# Patient Record
Sex: Male | Born: 1950 | Race: White | Hispanic: No | Marital: Single | Smoking: Former smoker
Health system: Southern US, Community
[De-identification: ages and names within clinical notes are randomized; demographics above are authoritative.]

## PROBLEM LIST (undated history)

## (undated) DIAGNOSIS — E785 Hyperlipidemia, unspecified: Secondary | ICD-10-CM

## (undated) DIAGNOSIS — M542 Cervicalgia: Secondary | ICD-10-CM

## (undated) DIAGNOSIS — M949 Disorder of cartilage, unspecified: Secondary | ICD-10-CM

## (undated) DIAGNOSIS — M899 Disorder of bone, unspecified: Secondary | ICD-10-CM

## (undated) DIAGNOSIS — R972 Elevated prostate specific antigen [PSA]: Secondary | ICD-10-CM

## (undated) DIAGNOSIS — C439 Malignant melanoma of skin, unspecified: Secondary | ICD-10-CM

## (undated) DIAGNOSIS — R21 Rash and other nonspecific skin eruption: Secondary | ICD-10-CM

## (undated) DIAGNOSIS — I10 Essential (primary) hypertension: Secondary | ICD-10-CM

## (undated) DIAGNOSIS — H469 Unspecified optic neuritis: Secondary | ICD-10-CM

## (undated) DIAGNOSIS — F411 Generalized anxiety disorder: Secondary | ICD-10-CM

## (undated) DIAGNOSIS — R011 Cardiac murmur, unspecified: Secondary | ICD-10-CM

## (undated) DIAGNOSIS — M19049 Primary osteoarthritis, unspecified hand: Secondary | ICD-10-CM

## (undated) HISTORY — DX: Cervicalgia: M54.2

## (undated) HISTORY — PX: HERNIA REPAIR: SHX51

## (undated) HISTORY — DX: Rash and other nonspecific skin eruption: R21

## (undated) HISTORY — DX: Disorder of bone, unspecified: M89.9

## (undated) HISTORY — DX: Hyperlipidemia, unspecified: E78.5

## (undated) HISTORY — DX: Disorder of cartilage, unspecified: M94.9

## (undated) HISTORY — DX: Generalized anxiety disorder: F41.1

## (undated) HISTORY — DX: Elevated prostate specific antigen (PSA): R97.20

## (undated) HISTORY — DX: Malignant melanoma of skin, unspecified: C43.9

## (undated) HISTORY — PX: CATARACT EXTRACTION: SUR2

## (undated) HISTORY — PX: OTHER SURGICAL HISTORY: SHX169

## (undated) HISTORY — DX: Primary osteoarthritis, unspecified hand: M19.049

---

## 2007-02-12 ENCOUNTER — Ambulatory Visit: Payer: Self-pay | Admitting: Internal Medicine

## 2007-02-12 LAB — CONVERTED CEMR LAB
ALT: 19 units/L (ref 0–40)
AST: 21 units/L (ref 0–37)
Albumin: 4 g/dL (ref 3.5–5.2)
Alkaline Phosphatase: 42 units/L (ref 39–117)
BUN: 5 mg/dL — ABNORMAL LOW (ref 6–23)
Basophils Absolute: 0.1 10*3/uL (ref 0.0–0.1)
Calcium: 9.3 mg/dL (ref 8.4–10.5)
Chloride: 99 meq/L (ref 96–112)
Eosinophils Absolute: 0.2 10*3/uL (ref 0.0–0.6)
Eosinophils Relative: 2.3 % (ref 0.0–5.0)
GFR calc non Af Amer: 125 mL/min
Glucose, Bld: 82 mg/dL (ref 70–99)
Ketones, ur: NEGATIVE mg/dL
MCV: 90.2 fL (ref 78.0–100.0)
Monocytes Relative: 6.7 % (ref 3.0–11.0)
Nitrite: NEGATIVE
Platelets: 295 10*3/uL (ref 150–400)
RBC: 4.71 M/uL (ref 4.22–5.81)
Specific Gravity, Urine: <=1.005 (ref 1.000–1.03)
TSH: 3.09 microintl units/mL (ref 0.35–5.50)
Total CHOL/HDL Ratio: 5
Total Protein, Urine: NEGATIVE mg/dL
Triglycerides: 180 mg/dL — ABNORMAL HIGH (ref 0–149)
Urine Glucose: NEGATIVE mg/dL
WBC: 8.7 10*3/uL (ref 4.5–10.5)

## 2007-02-17 ENCOUNTER — Ambulatory Visit: Payer: Self-pay | Admitting: Internal Medicine

## 2007-05-18 ENCOUNTER — Ambulatory Visit: Payer: Self-pay | Admitting: Internal Medicine

## 2007-05-18 LAB — CONVERTED CEMR LAB
Bilirubin, Direct: 0.1 mg/dL (ref 0.0–0.3)
Cholesterol: 195 mg/dL (ref 0–200)
HDL: 70.2 mg/dL (ref >39.0)
LDL Cholesterol: 111 mg/dL — ABNORMAL HIGH (ref 0–99)
Total Bilirubin: 0.7 mg/dL (ref 0.3–1.2)
Total CHOL/HDL Ratio: 2.8
Total Protein: 7.7 g/dL (ref 6.0–8.3)
Triglycerides: 68 mg/dL (ref 0–149)

## 2008-01-08 ENCOUNTER — Telehealth (INDEPENDENT_AMBULATORY_CARE_PROVIDER_SITE_OTHER): Payer: Self-pay | Admitting: *Deleted

## 2008-01-11 ENCOUNTER — Ambulatory Visit: Payer: Self-pay | Admitting: Internal Medicine

## 2008-01-11 DIAGNOSIS — M949 Disorder of cartilage, unspecified: Secondary | ICD-10-CM

## 2008-01-11 DIAGNOSIS — M899 Disorder of bone, unspecified: Secondary | ICD-10-CM | POA: Insufficient documentation

## 2008-01-11 HISTORY — DX: Disorder of bone, unspecified: M89.9

## 2008-01-14 ENCOUNTER — Encounter: Payer: Self-pay | Admitting: Internal Medicine

## 2008-01-15 ENCOUNTER — Telehealth: Payer: Self-pay | Admitting: Internal Medicine

## 2008-01-19 ENCOUNTER — Ambulatory Visit: Payer: Self-pay | Admitting: Internal Medicine

## 2008-01-19 DIAGNOSIS — R21 Rash and other nonspecific skin eruption: Secondary | ICD-10-CM

## 2008-01-19 DIAGNOSIS — F411 Generalized anxiety disorder: Secondary | ICD-10-CM

## 2008-01-19 DIAGNOSIS — M542 Cervicalgia: Secondary | ICD-10-CM | POA: Insufficient documentation

## 2008-01-19 DIAGNOSIS — E785 Hyperlipidemia, unspecified: Secondary | ICD-10-CM

## 2008-01-19 HISTORY — DX: Generalized anxiety disorder: F41.1

## 2008-01-19 HISTORY — DX: Hyperlipidemia, unspecified: E78.5

## 2008-01-19 HISTORY — DX: Rash and other nonspecific skin eruption: R21

## 2008-01-19 HISTORY — DX: Cervicalgia: M54.2

## 2008-03-31 ENCOUNTER — Telehealth: Payer: Self-pay | Admitting: Internal Medicine

## 2008-04-26 ENCOUNTER — Ambulatory Visit: Payer: Self-pay | Admitting: Internal Medicine

## 2008-04-26 LAB — CONVERTED CEMR LAB
ALT: 35 units/L (ref 0–53)
AST: 38 units/L — ABNORMAL HIGH (ref 0–37)
Albumin: 4.3 g/dL (ref 3.5–5.2)
BUN: 9 mg/dL (ref 6–23)
Basophils Absolute: 0 10*3/uL (ref 0.0–0.1)
Basophils Relative: 0.4 % (ref 0.0–1.0)
CO2: 29 meq/L (ref 19–32)
Calcium: 9.7 mg/dL (ref 8.4–10.5)
Chloride: 104 meq/L (ref 96–112)
Cholesterol: 185 mg/dL (ref 0–200)
Creatinine, Ser: 0.9 mg/dL (ref 0.4–1.5)
Eosinophils Relative: 1.2 % (ref 0.0–5.0)
Glucose, Bld: 69 mg/dL — ABNORMAL LOW (ref 70–99)
Hemoglobin: 14.7 g/dL (ref 13.0–17.0)
LDL Cholesterol: 102 mg/dL — ABNORMAL HIGH (ref 0–99)
Lymphocytes Relative: 25.6 % (ref 12.0–46.0)
MCHC: 35.3 g/dL (ref 30.0–36.0)
MCV: 89.2 fL (ref 78.0–100.0)
Neutro Abs: 5.7 10*3/uL (ref 1.4–7.7)
Neutrophils Relative %: 65.9 % (ref 43.0–77.0)
PSA: 0.48 ng/mL (ref 0.10–4.00)
RBC: 4.68 M/uL (ref 4.22–5.81)
TSH: 2.54 microintl units/mL (ref 0.35–5.50)
Total Protein: 7.7 g/dL (ref 6.0–8.3)
VLDL: 24 mg/dL (ref 0–40)
WBC: 8.6 10*3/uL (ref 4.5–10.5)

## 2008-04-29 ENCOUNTER — Ambulatory Visit: Payer: Self-pay | Admitting: Internal Medicine

## 2008-05-03 LAB — CONVERTED CEMR LAB
Bilirubin Urine: NEGATIVE
Hemoglobin, Urine: NEGATIVE
Nitrite: NEGATIVE
Urobilinogen, UA: 0.2 (ref 0.0–1.0)

## 2009-06-23 ENCOUNTER — Telehealth: Payer: Self-pay | Admitting: Internal Medicine

## 2009-07-03 ENCOUNTER — Ambulatory Visit: Payer: Self-pay | Admitting: Internal Medicine

## 2009-07-03 LAB — CONVERTED CEMR LAB
ALT: 21 units/L (ref 0–53)
Alkaline Phosphatase: 46 units/L (ref 39–117)
Basophils Absolute: 0 10*3/uL (ref 0.0–0.1)
Bilirubin, Direct: 0.1 mg/dL (ref 0.0–0.3)
Chloride: 104 meq/L (ref 96–112)
Cholesterol: 157 mg/dL (ref 0–200)
Creatinine, Ser: 0.9 mg/dL (ref 0.4–1.5)
Eosinophils Absolute: 0.1 10*3/uL (ref 0.0–0.7)
Hemoglobin, Urine: NEGATIVE
Hemoglobin: 14.2 g/dL (ref 13.0–17.0)
Ketones, ur: NEGATIVE mg/dL
LDL Cholesterol: 72 mg/dL (ref 0–99)
Lymphocytes Relative: 23.4 % (ref 12.0–46.0)
MCHC: 34.8 g/dL (ref 30.0–36.0)
Monocytes Relative: 7.4 % (ref 3.0–12.0)
Neutro Abs: 5.7 10*3/uL (ref 1.4–7.7)
Neutrophils Relative %: 67.5 % (ref 43.0–77.0)
Platelets: 250 10*3/uL (ref 150.0–400.0)
RDW: 12.5 % (ref 11.5–14.6)
Sodium: 140 meq/L (ref 135–145)
Total Bilirubin: 0.6 mg/dL (ref 0.3–1.2)
Total CHOL/HDL Ratio: 2
Triglycerides: 88 mg/dL (ref 0.0–149.0)
Urine Glucose: NEGATIVE mg/dL
Urobilinogen, UA: 0.2 (ref 0.0–1.0)
VLDL: 17.6 mg/dL (ref 0.0–40.0)

## 2009-07-10 ENCOUNTER — Ambulatory Visit: Payer: Self-pay | Admitting: Internal Medicine

## 2009-08-25 ENCOUNTER — Telehealth: Payer: Self-pay | Admitting: Internal Medicine

## 2010-01-04 ENCOUNTER — Encounter: Payer: Self-pay | Admitting: Internal Medicine

## 2010-02-13 ENCOUNTER — Telehealth (INDEPENDENT_AMBULATORY_CARE_PROVIDER_SITE_OTHER): Payer: Self-pay | Admitting: *Deleted

## 2010-02-19 ENCOUNTER — Encounter: Payer: Self-pay | Admitting: Internal Medicine

## 2010-02-23 ENCOUNTER — Encounter (INDEPENDENT_AMBULATORY_CARE_PROVIDER_SITE_OTHER): Payer: Self-pay | Admitting: *Deleted

## 2010-03-09 ENCOUNTER — Encounter: Payer: Self-pay | Admitting: Internal Medicine

## 2010-04-12 ENCOUNTER — Telehealth: Payer: Self-pay | Admitting: Internal Medicine

## 2010-07-09 ENCOUNTER — Ambulatory Visit: Payer: Self-pay | Admitting: Internal Medicine

## 2010-07-09 LAB — CONVERTED CEMR LAB
ALT: 23 units/L (ref 0–53)
Basophils Relative: 0.7 % (ref 0.0–3.0)
Bilirubin Urine: NEGATIVE
Bilirubin, Direct: 0.1 mg/dL (ref 0.0–0.3)
CO2: 29 meq/L (ref 19–32)
Calcium: 9 mg/dL (ref 8.4–10.5)
Creatinine, Ser: 0.8 mg/dL (ref 0.4–1.5)
Eosinophils Relative: 2.1 % (ref 0.0–5.0)
Glucose, Bld: 72 mg/dL (ref 70–99)
HCT: 41 % (ref 39.0–52.0)
Hemoglobin: 14.4 g/dL (ref 13.0–17.0)
Ketones, ur: NEGATIVE mg/dL
Leukocytes, UA: NEGATIVE
Lymphs Abs: 2.4 10*3/uL (ref 0.7–4.0)
MCV: 92.4 fL (ref 78.0–100.0)
Monocytes Absolute: 0.6 10*3/uL (ref 0.1–1.0)
Neutro Abs: 5.1 10*3/uL (ref 1.4–7.7)
Neutrophils Relative %: 61.5 % (ref 43.0–77.0)
PSA: 3.58 ng/mL (ref 0.10–4.00)
RBC: 4.44 M/uL (ref 4.22–5.81)
Total Protein: 7 g/dL (ref 6.0–8.3)
WBC: 8.3 10*3/uL (ref 4.5–10.5)
pH: 5.5 (ref 5.0–8.0)

## 2010-07-12 ENCOUNTER — Ambulatory Visit: Payer: Self-pay | Admitting: Internal Medicine

## 2010-07-12 DIAGNOSIS — R972 Elevated prostate specific antigen [PSA]: Secondary | ICD-10-CM | POA: Insufficient documentation

## 2010-07-12 HISTORY — DX: Elevated prostate specific antigen (PSA): R97.20

## 2010-07-13 ENCOUNTER — Encounter (INDEPENDENT_AMBULATORY_CARE_PROVIDER_SITE_OTHER): Payer: Self-pay | Admitting: *Deleted

## 2010-08-13 ENCOUNTER — Encounter: Payer: Self-pay | Admitting: Internal Medicine

## 2010-09-11 ENCOUNTER — Encounter: Payer: Self-pay | Admitting: Internal Medicine

## 2010-09-17 ENCOUNTER — Encounter: Payer: Self-pay | Admitting: Internal Medicine

## 2010-12-24 ENCOUNTER — Encounter: Payer: Self-pay | Admitting: Internal Medicine

## 2011-01-15 NOTE — Op Note (Signed)
Summary: [facility]   Imported By: Sherian Rein 02/23/2010 11:38:19  _____________________________________________________________________  External Attachment:    Type:   Image     Comment:   External Document

## 2011-01-15 NOTE — Progress Notes (Signed)
Summary: Rx refill  Phone Note Call from Patient   Caller: Patient [phone] Summary of Call: pt called requesting refills of Cialis 20mg . Pt had no more refills because he was buying #10 per month instead of the #5 Rx'd. Pt said that his Rx used to be for #10 1 by mouth q other day and this is why he bought more. Pt has scheduled CPX 07/28. Pt is requesting 2 mth refill until appt. Initial call taken by: Margaret Pyle, CMA,  April 12, 2010 2:25 PM  Follow-up for Phone Call        ok for 10/month - to robin to handle Follow-up by: Corwin Levins MD,  April 12, 2010 2:39 PM    Prescriptions: CIALIS 20 MG TABS (TADALAFIL) 1 by mouth every other day  #10 x 1   Entered by:   Scharlene Gloss   Authorized by:   Corwin Levins MD   Signed by:   Scharlene Gloss on 04/12/2010   Method used:   Faxed to ...       [facility]  [address] [phone]* (retail)       [address]       [place], [place]  [address]       Ph: [phone] or [phone]       Fax: [phone]   RxID:   [id number]

## 2011-01-15 NOTE — Letter (Signed)
Summary: [facility] Consult Scheduled Letter  [facility]  [address] [place], [place] [address]   Phone: [phone]  Fax: [phone]      07/13/2010 MRN: [id number]  [facility] [address] [place], [place]  [address]    Dear Mr. Gouveia,      We have scheduled an appointment for you. At the recommendation of Dr.John,we have scheduled you a consult with [facility](Dr.Peterson)on August 29,2011 at 2:45 PM. Their phone number is [phone].  If this appointment day and time is not convenient for you, please feel free to call the office of the doctor you are being referred to at the number listed above and reschedule the appointment.  [facility] [address] [place] [address] [facility] [address] [place], [place] [address]  Thank you,  Patient Care Coordinator [facility]

## 2011-01-15 NOTE — Letter (Signed)
Summary: [facility]  [facility]   Imported By: Lennie Odor 08/21/2010 14:17:24  _____________________________________________________________________  External Attachment:    Type:   Image     Comment:   External Document

## 2011-01-15 NOTE — Progress Notes (Signed)
  Faxed 12,LOV over to [facility] to fax [phone] [facility]  February 13, 2010 1:35 PM

## 2011-01-15 NOTE — Miscellaneous (Signed)
Summary: Orders Update   Clinical Lists Changes  Orders: Added new Referral order of Gastroenterology Referral (GI) - Signed 

## 2011-01-15 NOTE — Assessment & Plan Note (Signed)
Summary: CPX / NWS  #   Vital Signs:  Patient profile:   60 year old male Height:      73 inches (185.42 cm) Weight:      165.38 pounds (75.17 kg) BMI:     21.90 O2 Sat:      97 % on Room air Temp:     97.1 degrees F (36.17 degrees C) oral Pulse rate:   73 / minute BP sitting:   122 / 76  (left arm) Cuff size:   regular  Vitals Entered By: Brenton Grills MA (July 12, 2010 10:28 AM)  O2 Flow:  Room air CC: Physical/refills on all meds/aj   Primary Care Provider:  Jonny Ruiz  CC:  Physical/refills on all meds/aj.  History of Present Illness: overall doing well.  Pt denies CP, sob, doe, wheezing, orthopnea, pnd, worsening LE edema, palps, dizziness or syncope  Pt denies new neuro symptoms such as headache, facial or extremity weakness   No fever, wt loss, night sweats, loss of appetite or other constitutional symptoms   No new complaints.    Problems Prior to Update: 1)  Psa, Increased  (ICD-790.93) 2)  Preventive Health Care  (ICD-V70.0) 3)  Anxiety  (ICD-300.00) 4)  Hyperlipidemia  (ICD-272.4) 5)  Rash-nonvesicular  (ICD-782.1) 6)  Cervicalgia  (ICD-723.1) 7)  Disorder of Bone and Cartilage Unspecified  (ICD-733.90)  Medications Prior to Update: 1)  Viagra 100 Mg Tabs (Sildenafil Citrate) .Marland Kitchen.. 1po Every Other Day As Needed 2)  Crestor 40 Mg Tabs (Rosuvastatin Calcium) .... Take 1/2 Tablet By Mouth Once A Day 3)  Cialis 20 Mg Tabs (Tadalafil) .Marland Kitchen.. 1 By Mouth Every Other Day 4)  Triamcinolone Acetonide 0.5 % Crea (Triamcinolone Acetonide) .... Use Asd Two Times A Day As Needed  Current Medications (verified): 1)  Viagra 100 Mg Tabs (Sildenafil Citrate) .Marland Kitchen.. 1po Every Other Day As Needed 2)  Crestor 40 Mg Tabs (Rosuvastatin Calcium) .... Take 1/2 Tablet By Mouth Once A Day 3)  Cialis 20 Mg Tabs (Tadalafil) .Marland Kitchen.. 1 By Mouth Every Other Day 4)  Aspir-Low 81 Mg Tbec (Aspirin) .Marland Kitchen.. 1 By Mouth Once Daily  Allergies (verified): 1)  ! Pcn  Past History:  Past Medical  History: Last updated: 07/10/2009 hx of childhood glomerulonephritis Hyperlipidemia E.D. 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         all otherwise negative per pt -    Physical Exam  General:  alert and well-developed.   Head:  normocephalic and atraumatic.   Eyes:  vision grossly intact, pupils equal, and pupils round.   Ears:  R ear normal and L ear normal.   Nose:  no external deformity and no nasal discharge.   Mouth:  no gingival abnormalities and pharynx  pink and moist.   Neck:  supple and no masses.   Lungs:  normal respiratory effort and normal breath sounds.   Heart:  normal rate and regular rhythm.   Abdomen:  soft, non-tender, and normal bowel sounds.   Msk:  no joint tenderness and no joint swelling.   Extremities:  no edema, no erythema  Neurologic:  cranial nerves II-XII intact and strength normal in all extremities.   Skin:  color normal and no rashes.   Psych:  not anxious appearing and not  depressed appearing.     Impression & Recommendations:  Problem # 1:  Preventive Health Care (ICD-V70.0) Overall doing well, age appropriate education and counseling updated and referral for appropriate preventive services done unless declined, immunizations up to date or declined, diet counseling done if overweight, urged to quit smoking if smokes , most recent labs reviewed and current ordered if appropriate, ecg reviewed or declined (interpretation per ECG scanned in the EMR if done); information regarding Medicare Prevention requirements given if appropriate; speciality referrals updated as appropriate   Problem # 2:  PSA, INCREASED (ICD-790.93) PSA velocity quite remarkable from july 2010 -  for urology referral Orders: Urology Referral (Urology)  Complete Medication List: 1)  Viagra 100 Mg Tabs (Sildenafil citrate) .Marland Kitchen.. 1po every other day as needed 2)  Crestor 40 Mg Tabs (Rosuvastatin calcium) .... Take 1/2 tablet by mouth once a day 3)  Cialis 20 Mg Tabs (Tadalafil) .Marland Kitchen.. 1 by mouth every other day 4)  Aspir-low 81 Mg Tbec (Aspirin) .Marland Kitchen.. 1 by mouth once daily  Patient Instructions: 1)  Continue all previous medications as before this visit 2)  You will be contacted about the referral(s) to: Urology - Dr Vonita Moss 3)  You are given the refills today 4)  Please schedule a follow-up appointment in 1 year or sooner if needed 5)  Take an Aspirin every day - 81 mg - 1 per day  - COATED only - to help reduce chance of stroke or heart disease 6)  We will call in 3 mo to get you scheduled for the screening colonoscopy Prescriptions: CIALIS 20 MG TABS (TADALAFIL) 1 by mouth every other day  #10 x 11   Entered and Authorized by:   Corwin Levins MD   Signed by:   Corwin Levins MD on 07/12/2010   Method used:   Print then Give to Patient   RxID:   [id number] VIAGRA 100 MG TABS (SILDENAFIL CITRATE) 1po every other day as needed  #10 x 11   Entered and Authorized by:   Corwin Levins MD    Signed by:   Corwin Levins MD on 07/12/2010   Method used:   Print then Give to Patient   RxID:   [id number] CRESTOR 40 MG TABS (ROSUVASTATIN CALCIUM) Take 1/2 tablet by mouth once a day  #45 x 3   Entered and Authorized by:   Corwin Levins MD   Signed by:   Corwin Levins MD on 07/12/2010   Method used:   Print then Give to Patient   RxID:   [id number]

## 2011-01-15 NOTE — Letter (Signed)
Summary: Referral - not able to see patient  [facility]  [address] [place], [place] [address]   Phone: [phone]  Fax: [phone]    February 23, 2010   Re:   Gerald Jenkins DOB:  [birth year] MRN:   [id number]    Dear Dr. Jonny Ruiz:  Thank you for your kind referral of the above patient.  We have attempted to schedule the recommended procedure, colonoscopy, but have not been able to schedule because:  _x_ The patient was not available by phone and/or has not returned our calls.  ___ The patient declined to schedule the procedure at this time.  We appreciate the referral and hope that we will have the opportunity to treat this patient in the future.    Sincerely,    [facility] [phone]

## 2011-01-15 NOTE — Letter (Signed)
Summary: [facility]   Imported By: Sherian Rein 04/17/2010 08:55:55  _____________________________________________________________________  External Attachment:    Type:   Image     Comment:   External Document

## 2011-01-15 NOTE — Letter (Signed)
Summary: [facility]   Imported By: Lester [place] 01/18/2010 10:00:28  _____________________________________________________________________  External Attachment:    Type:   Image     Comment:   External Document

## 2011-01-17 NOTE — Letter (Signed)
Summary: Referral - not able to see patient  [facility]  [address] [place], [place] [address]   Phone: [phone]  Fax: [phone]    December 24, 2010  Oliver Barre, MD [address] [place], [place] [address]   Re:   NIK GORRELL DOB:  1951/10/22 MRN:   [id number]    Dear Dr. Jonny Ruiz:  Thank you for your kind referral of the above patient.  We have attempted to schedule the recommended procedure Screening Colonoscopy but have not been able to schedule because:  X   The patient was not available by phone and/or has not returned our calls.  ___ The patient declined to schedule the procedure at this time.  We appreciate the referral and hope that we will have the opportunity to treat this patient in the future.    Sincerely,    [facility] [phone]

## 2011-02-12 ENCOUNTER — Encounter: Payer: Self-pay | Admitting: Internal Medicine

## 2011-07-16 ENCOUNTER — Other Ambulatory Visit: Payer: Self-pay

## 2011-07-18 ENCOUNTER — Ambulatory Visit: Payer: Self-pay

## 2011-07-18 DIAGNOSIS — Z0000 Encounter for general adult medical examination without abnormal findings: Secondary | ICD-10-CM

## 2011-07-18 DIAGNOSIS — Z0389 Encounter for observation for other suspected diseases and conditions ruled out: Secondary | ICD-10-CM

## 2011-07-18 LAB — HEPATIC FUNCTION PANEL
AST: 26 U/L (ref 0–37)
Albumin: 4.4 g/dL (ref 3.5–5.2)
Alkaline Phosphatase: 43 U/L (ref 39–117)
Bilirubin, Direct: 0.1 mg/dL (ref 0.0–0.3)
Total Protein: 7.2 g/dL (ref 6.0–8.3)

## 2011-07-18 LAB — CBC WITH DIFFERENTIAL/PLATELET
Basophils Relative: 0.4 % (ref 0.0–3.0)
Eosinophils Relative: 2.2 % (ref 0.0–5.0)
HCT: 43 % (ref 39.0–52.0)
Hemoglobin: 14.5 g/dL (ref 13.0–17.0)
Lymphocytes Relative: 24.8 % (ref 12.0–46.0)
Lymphs Abs: 1.8 10*3/uL (ref 0.7–4.0)
Monocytes Relative: 8.3 % (ref 3.0–12.0)
Neutro Abs: 4.6 10*3/uL (ref 1.4–7.7)
RBC: 4.68 Mil/uL (ref 4.22–5.81)

## 2011-07-18 LAB — URINALYSIS
Bilirubin Urine: NEGATIVE
Ketones, ur: NEGATIVE
Leukocytes, UA: NEGATIVE
Nitrite: NEGATIVE
Specific Gravity, Urine: 1.02 (ref 1.000–1.030)
Total Protein, Urine: NEGATIVE
pH: 5.5 (ref 5.0–8.0)

## 2011-07-18 LAB — BASIC METABOLIC PANEL
Calcium: 9.2 mg/dL (ref 8.4–10.5)
GFR: 87.26 mL/min (ref >60.00)
Glucose, Bld: 81 mg/dL (ref 70–99)
Potassium: 4.3 mEq/L (ref 3.5–5.1)
Sodium: 143 mEq/L (ref 135–145)

## 2011-07-18 LAB — LIPID PANEL
HDL: 75 mg/dL (ref >39.00)
Total CHOL/HDL Ratio: 2
VLDL: 30.2 mg/dL (ref 0.0–40.0)

## 2011-07-21 ENCOUNTER — Encounter: Payer: Self-pay | Admitting: Internal Medicine

## 2011-07-21 DIAGNOSIS — Z0000 Encounter for general adult medical examination without abnormal findings: Secondary | ICD-10-CM | POA: Insufficient documentation

## 2011-07-23 ENCOUNTER — Ambulatory Visit (INDEPENDENT_AMBULATORY_CARE_PROVIDER_SITE_OTHER): Payer: 59 | Admitting: Internal Medicine

## 2011-07-23 ENCOUNTER — Encounter: Payer: Self-pay | Admitting: Internal Medicine

## 2011-07-23 VITALS — BP 130/72 | HR 70 | Temp 98.5°F | Ht 73.0 in | Wt 171.6 lb

## 2011-07-23 DIAGNOSIS — Z0000 Encounter for general adult medical examination without abnormal findings: Secondary | ICD-10-CM

## 2011-07-23 NOTE — Progress Notes (Signed)
Subjective:    Patient ID: Gerald Jenkins, male    DOB: 1951/10/01, 60 y.o.   MRN: [id number]  HPI Here for wellness and f/u;  Overall doing ok;  Pt denies CP, worsening SOB, DOE, wheezing, orthopnea, PND, worsening LE edema, palpitations, dizziness or syncope.  Pt denies neurological change such as new Headache, facial or extremity weakness.  Pt denies polydipsia, polyuria, or low sugar symptoms. Pt states overall good compliance with treatment and medications, good tolerability, and trying to follow lower cholesterol diet.  Pt denies worsening depressive symptoms, suicidal ideation or panic. No fever, wt loss, night sweats, loss of appetite, or other constitutional symptoms.  Pt states good ability with ADL's, low fall risk, home safety reviewed and adequate, no significant changes in hearing or vision, and occasionally active with exercise.  Still smokes < 1/2 ppd in the evenings to relax.  Also with mild tenderness to left elbow, worse later in the workday Past Medical History  Diagnosis Date  . ANXIETY 01/19/2008  . Cervicalgia 01/19/2008  . DISORDER OF BONE AND CARTILAGE UNSPECIFIED 01/11/2008  . HYPERLIPIDEMIA 01/19/2008  . PSA, INCREASED 07/12/2010  . RASH-NONVESICULAR 01/19/2008   Past Surgical History  Procedure Date  . Cataract extraction   . Lumbar disease     reports that he has been smoking.  He does not have any smokeless tobacco history on file. He reports that he drinks alcohol. His drug history not on file. family history includes Cancer in his other; Diabetes in his other; Lumbar disc disease in his father; and Multiple sclerosis in his other. Allergies  Allergen Reactions  . Penicillins    Current Outpatient Prescriptions on File Prior to Visit  Medication Sig Dispense Refill  . aspirin 81 MG tablet Take 81 mg by mouth daily.        . rosuvastatin (CRESTOR) 40 MG tablet Take 1/2 tablet by mouth once daily       . sildenafil (VIAGRA) 100 MG tablet 1 by mouth every other day as  needed       . tadalafil (CIALIS) 20 MG tablet 1 by mouth every other day        Review of Systems Review of Systems  Constitutional: Negative for diaphoresis, activity change, appetite change and unexpected weight change.  HENT: Negative for hearing loss, ear pain, facial swelling, mouth sores and neck stiffness.   Eyes: Negative for pain, redness and visual disturbance.  Respiratory: Negative for shortness of breath and wheezing.   Cardiovascular: Negative for chest pain and palpitations.  Gastrointestinal: Negative for diarrhea, blood in stool, abdominal distention and rectal pain.  Genitourinary: Negative for hematuria, flank pain and decreased urine volume.  Musculoskeletal: Negative for myalgias and joint swelling.  Skin: Negative for color change and wound.  Neurological: Negative for syncope and numbness.  Hematological: Negative for adenopathy.  Psychiatric/Behavioral: Negative for hallucinations, self-injury, decreased concentration and agitation.      Objective:   Physical Exam BP 130/72  Pulse 70  Temp(Src) 98.5 F (36.9 C) (Oral)  Ht 6\' 1"  (1.854 m)  Wt 171 lb 9.6 oz (77.837 kg)  BMI 22.64 kg/m2  SpO2 97% Physical Exam  VS noted Constitutional: Pt is oriented to person, place, and time. Appears well-developed and well-nourished.  HENT:  Head: Normocephalic and atraumatic.  Right Ear: External ear normal.  Left Ear: External ear normal.  Nose: Nose normal.  Mouth/Throat: Oropharynx is clear and moist.  Eyes: Conjunctivae and EOM are normal. Pupils are equal, round, and  reactive to light.  Neck: Normal range of motion. Neck supple. No JVD present. No tracheal deviation present.  Cardiovascular: Normal rate, regular rhythm, normal heart sounds and intact distal pulses.   Pulmonary/Chest: Effort normal and breath sounds normal.  Abdominal: Soft. Bowel sounds are normal. There is no tenderness.  Musculoskeletal: Normal range of motion. Exhibits no edema.    Lymphadenopathy:  Has no cervical adenopathy.  Neurological: Pt is alert and oriented to person, place, and time. Pt has normal reflexes. No cranial nerve deficit.  Skin: Skin is warm and dry. No rash noted.  Psychiatric:  Has  normal mood and affect. Behavior is normal.         Assessment & Plan:

## 2011-07-23 NOTE — Patient Instructions (Addendum)
You will be contacted regarding the referral for: colonoscopy Please call if you need orthopedic for the left elbow tendonitis Please stop smoking Continue all medications as before Please have the pharmacy notify us when you need refills Please return in 1 year for your yearly visit, or sooner if needed, with Lab testing done 3-5 days before

## 2011-08-08 ENCOUNTER — Other Ambulatory Visit: Payer: Self-pay

## 2011-08-08 MED ORDER — ROSUVASTATIN CALCIUM 40 MG PO TABS: 40.0000 mg | ORAL_TABLET | Freq: Every day | ORAL | Status: DC

## 2011-08-08 MED ORDER — TADALAFIL 20 MG PO TABS: ORAL_TABLET | ORAL | Status: DC

## 2011-08-08 MED ORDER — SILDENAFIL CITRATE 100 MG PO TABS: ORAL_TABLET | ORAL | Status: DC

## 2011-09-30 ENCOUNTER — Encounter: Payer: Self-pay | Admitting: Gastroenterology

## 2012-03-06 ENCOUNTER — Other Ambulatory Visit: Payer: Self-pay

## 2012-03-06 MED ORDER — ROSUVASTATIN CALCIUM 40 MG PO TABS: 40.0000 mg | ORAL_TABLET | Freq: Every day | ORAL | Status: DC

## 2012-08-06 ENCOUNTER — Other Ambulatory Visit: Payer: Self-pay | Admitting: Internal Medicine

## 2012-08-11 ENCOUNTER — Other Ambulatory Visit (INDEPENDENT_AMBULATORY_CARE_PROVIDER_SITE_OTHER): Payer: 59

## 2012-08-11 DIAGNOSIS — Z0000 Encounter for general adult medical examination without abnormal findings: Secondary | ICD-10-CM

## 2012-08-11 LAB — BASIC METABOLIC PANEL
BUN: 14 mg/dL (ref 6–23)
CO2: 28 mEq/L (ref 19–32)
Chloride: 107 mEq/L (ref 96–112)
Creatinine, Ser: 0.8 mg/dL (ref 0.4–1.5)
Glucose, Bld: 81 mg/dL (ref 70–99)

## 2012-08-11 LAB — CBC WITH DIFFERENTIAL/PLATELET
Basophils Relative: 0.3 % (ref 0.0–3.0)
Eosinophils Absolute: 0.2 10*3/uL (ref 0.0–0.7)
Eosinophils Relative: 1.9 % (ref 0.0–5.0)
Lymphocytes Relative: 28.7 % (ref 12.0–46.0)
MCHC: 33.3 g/dL (ref 30.0–36.0)
Neutrophils Relative %: 59.9 % (ref 43.0–77.0)
RBC: 4.58 Mil/uL (ref 4.22–5.81)
WBC: 8.1 10*3/uL (ref 4.5–10.5)

## 2012-08-11 LAB — HEPATIC FUNCTION PANEL
Alkaline Phosphatase: 41 U/L (ref 39–117)
Bilirubin, Direct: 0.1 mg/dL (ref 0.0–0.3)
Total Bilirubin: 0.5 mg/dL (ref 0.3–1.2)
Total Protein: 7.1 g/dL (ref 6.0–8.3)

## 2012-08-11 LAB — URINALYSIS, ROUTINE W REFLEX MICROSCOPIC
Ketones, ur: NEGATIVE
Specific Gravity, Urine: 1.025 (ref 1.000–1.030)
Urine Glucose: NEGATIVE
pH: 6 (ref 5.0–8.0)

## 2012-08-11 LAB — LIPID PANEL
Cholesterol: 159 mg/dL (ref 0–200)
HDL: 67.8 mg/dL (ref >39.00)
VLDL: 20.8 mg/dL (ref 0.0–40.0)

## 2012-08-13 ENCOUNTER — Encounter: Payer: Self-pay | Admitting: Internal Medicine

## 2012-08-13 ENCOUNTER — Ambulatory Visit (INDEPENDENT_AMBULATORY_CARE_PROVIDER_SITE_OTHER): Payer: No Typology Code available for payment source | Admitting: Internal Medicine

## 2012-08-13 VITALS — BP 134/80 | HR 84 | Temp 97.9°F | Ht 73.5 in | Wt 175.2 lb

## 2012-08-13 DIAGNOSIS — Z0000 Encounter for general adult medical examination without abnormal findings: Secondary | ICD-10-CM

## 2012-08-13 MED ORDER — ROSUVASTATIN CALCIUM 40 MG PO TABS: 40.0000 mg | ORAL_TABLET | Freq: Every day | ORAL | Status: DC

## 2012-08-13 MED ORDER — SILDENAFIL CITRATE 100 MG PO TABS: ORAL_TABLET | ORAL | Status: DC

## 2012-08-13 MED ORDER — TADALAFIL 20 MG PO TABS: ORAL_TABLET | ORAL | Status: DC

## 2012-08-13 NOTE — Patient Instructions (Addendum)
Continue all other medications as before Your refills were done today as requested You will be contacted regarding the referral for: colonoscopy You are otherwise up to date with prevention We should consider the shingles shot at your visit in 1 yr Please return in 1 year for your yearly visit, or sooner if needed, with Lab testing done 3-5 days before

## 2012-08-13 NOTE — Assessment & Plan Note (Signed)
Overall doing well, age appropriate education and counseling updated, referrals for preventative services and immunizations addressed, dietary and smoking counseling addressed, most recent labs and ECG reviewed.  I have personally reviewed and have noted: 1) the patient's medical and social history 2) The pt's use of alcohol, tobacco, and illicit drugs 3) The patient's current medications and supplements 4) Functional ability including ADL's, fall risk, home safety risk, hearing and visual impairment 5) Diet and physical activities 6) Evidence for depression or mood disorder 7) The patient's height, weight, and BMI have been recorded in the chart I have made referrals, and provided counseling and education based on review of the above For colonscopy as he is due, ECG reviewed as per emr

## 2012-08-15 ENCOUNTER — Encounter: Payer: Self-pay | Admitting: Internal Medicine

## 2012-08-15 NOTE — Progress Notes (Signed)
Subjective:    Patient ID: Gerald Jenkins, male    DOB: April 03, 1951, 61 y.o.   MRN: [id number]  HPI  Here for wellness and f/u;  Overall doing ok;  Pt denies CP, worsening SOB, DOE, wheezing, orthopnea, PND, worsening LE edema, palpitations, dizziness or syncope.  Pt denies neurological change such as new Headache, facial or extremity weakness.  Pt denies polydipsia, polyuria, or low sugar symptoms. Pt states overall good compliance with treatment and medications, good tolerability, and trying to follow lower cholesterol diet.  Pt denies worsening depressive symptoms, suicidal ideation or panic. No fever, wt loss, night sweats, loss of appetite, or other constitutional symptoms.  Pt states good ability with ADL's, low fall risk, home safety reviewed and adequate, no significant changes in hearing or vision, and occasionally active with exercise.  No acute complaints.  Wt is up several lbs over next yr from 171 to 175 Past Medical History  Diagnosis Date  . ANXIETY 01/19/2008  . Cervicalgia 01/19/2008  . DISORDER OF BONE AND CARTILAGE UNSPECIFIED 01/11/2008  . HYPERLIPIDEMIA 01/19/2008  . PSA, INCREASED 07/12/2010  . RASH-NONVESICULAR 01/19/2008   Past Surgical History  Procedure Date  . Cataract extraction   . Lumbar disease     reports that he has been smoking.  He does not have any smokeless tobacco history on file. He reports that he drinks alcohol. His drug history not on file. family history includes Cancer in his other; Diabetes in his other; Lumbar disc disease in his father; and Multiple sclerosis in his other. Allergies  Allergen Reactions  . Penicillins    Current Outpatient Prescriptions on File Prior to Visit  Medication Sig Dispense Refill  . aspirin 81 MG tablet Take 81 mg by mouth daily.        . rosuvastatin (CRESTOR) 40 MG tablet Take 1 tablet (40 mg total) by mouth daily. Take 1/2 tablet by mouth once daily  45 tablet  3  . sildenafil (VIAGRA) 100 MG tablet 1 by mouth every other  day as needed  10 tablet  11  . tadalafil (CIALIS) 20 MG tablet 1 by mouth every other day  10 tablet  11   Review of Systems Review of Systems  Constitutional: Negative for diaphoresis, activity change, appetite change and unexpected weight change.  HENT: Negative for hearing loss, ear pain, facial swelling, mouth sores and neck stiffness.   Eyes: Negative for pain, redness and visual disturbance.  Respiratory: Negative for shortness of breath and wheezing.   Cardiovascular: Negative for chest pain and palpitations.  Gastrointestinal: Negative for diarrhea, blood in stool, abdominal distention and rectal pain.  Genitourinary: Negative for hematuria, flank pain and decreased urine volume.  Musculoskeletal: Negative for myalgias and joint swelling.  Skin: Negative for color change and wound.  Neurological: Negative for syncope and numbness.  Hematological: Negative for adenopathy.  Psychiatric/Behavioral: Negative for hallucinations, self-injury, decreased concentration and agitation.      Objective:   Physical Exam BP 134/80  Pulse 84  Temp 97.9 F (36.6 C) (Oral)  Ht 6' 1.5" (1.867 m)  Wt 175 lb 4 oz (79.493 kg)  BMI 22.81 kg/m2  SpO2 97% Physical Exam  VS noted Constitutional: Pt is oriented to person, place, and time. Appears well-developed and well-nourished.  HENT:  Head: Normocephalic and atraumatic.  Right Ear: External ear normal.  Left Ear: External ear normal.  Nose: Nose normal.  Mouth/Throat: Oropharynx is clear and moist.  Eyes: Conjunctivae and EOM are normal. Pupils are  equal, round, and reactive to light.  Neck: Normal range of motion. Neck supple. No JVD present. No tracheal deviation present.  Cardiovascular: Normal rate, regular rhythm, normal heart sounds and intact distal pulses.   Pulmonary/Chest: Effort normal and breath sounds normal.  Abdominal: Soft. Bowel sounds are normal. There is no tenderness.  Musculoskeletal: Normal range of motion. Exhibits  no edema.  Lymphadenopathy:  Has no cervical adenopathy.  Neurological: Pt is alert and oriented to person, place, and time. Pt has normal reflexes. No cranial nerve deficit. Motor/dtr/gait intact Skin: Skin is warm and dry. No rash noted.  Psychiatric:  Has  normal mood and affect. Behavior is normal.     Assessment & Plan:

## 2013-07-19 ENCOUNTER — Telehealth: Payer: Self-pay

## 2013-07-19 DIAGNOSIS — Z0000 Encounter for general adult medical examination without abnormal findings: Secondary | ICD-10-CM

## 2013-07-19 NOTE — Telephone Encounter (Signed)
cpx labs entered  

## 2013-08-17 ENCOUNTER — Other Ambulatory Visit (INDEPENDENT_AMBULATORY_CARE_PROVIDER_SITE_OTHER): Payer: No Typology Code available for payment source

## 2013-08-17 DIAGNOSIS — Z0000 Encounter for general adult medical examination without abnormal findings: Secondary | ICD-10-CM

## 2013-08-17 LAB — CBC WITH DIFFERENTIAL/PLATELET
Basophils Relative: 0.5 % (ref 0.0–3.0)
Eosinophils Relative: 2.5 % (ref 0.0–5.0)
HCT: 40.6 % (ref 39.0–52.0)
Lymphs Abs: 2.5 10*3/uL (ref 0.7–4.0)
MCV: 90.3 fl (ref 78.0–100.0)
Monocytes Absolute: 0.9 10*3/uL (ref 0.1–1.0)
Neutro Abs: 5 10*3/uL (ref 1.4–7.7)
RBC: 4.49 Mil/uL (ref 4.22–5.81)
WBC: 8.6 10*3/uL (ref 4.5–10.5)

## 2013-08-17 LAB — URINALYSIS, ROUTINE W REFLEX MICROSCOPIC
Nitrite: NEGATIVE
Specific Gravity, Urine: 1.02 (ref 1.000–1.030)
Total Protein, Urine: NEGATIVE
Urine Glucose: NEGATIVE
Urobilinogen, UA: 0.2 (ref 0.0–1.0)
WBC, UA: NONE SEEN (ref 0–?)

## 2013-08-17 LAB — HEPATIC FUNCTION PANEL
Albumin: 4 g/dL (ref 3.5–5.2)
Alkaline Phosphatase: 36 U/L — ABNORMAL LOW (ref 39–117)
Bilirubin, Direct: 0.1 mg/dL (ref 0.0–0.3)
Total Protein: 6.9 g/dL (ref 6.0–8.3)

## 2013-08-17 LAB — BASIC METABOLIC PANEL
CO2: 27 mEq/L (ref 19–32)
Calcium: 9.1 mg/dL (ref 8.4–10.5)
Creatinine, Ser: 1.1 mg/dL (ref 0.4–1.5)
GFR: 76.26 mL/min (ref >60.00)
Sodium: 136 mEq/L (ref 135–145)

## 2013-08-17 LAB — LIPID PANEL
LDL Cholesterol: 72 mg/dL (ref 0–99)
Total CHOL/HDL Ratio: 3

## 2013-08-19 ENCOUNTER — Ambulatory Visit (INDEPENDENT_AMBULATORY_CARE_PROVIDER_SITE_OTHER): Payer: BC Managed Care – PPO | Admitting: Internal Medicine

## 2013-08-19 ENCOUNTER — Encounter: Payer: Self-pay | Admitting: Internal Medicine

## 2013-08-19 VITALS — BP 122/70 | HR 81 | Temp 97.2°F | Ht 73.5 in | Wt 182.0 lb

## 2013-08-19 DIAGNOSIS — Z23 Encounter for immunization: Secondary | ICD-10-CM

## 2013-08-19 DIAGNOSIS — Z2911 Encounter for prophylactic immunotherapy for respiratory syncytial virus (RSV): Secondary | ICD-10-CM

## 2013-08-19 DIAGNOSIS — Z0000 Encounter for general adult medical examination without abnormal findings: Secondary | ICD-10-CM

## 2013-08-19 MED ORDER — ROSUVASTATIN CALCIUM 40 MG PO TABS: ORAL_TABLET | ORAL | Status: DC

## 2013-08-19 NOTE — Patient Instructions (Addendum)
You had the shingles shot today  You will be contacted regarding the referral for: colonoscopy  Please continue all other medications as before, and refills have been done if requested. Please have the pharmacy call with any other refills you may need.  Please continue your efforts at being more active, low cholesterol diet, and weight control. You are otherwise up to date with prevention measures today.  Please remember to sign up for My Chart if you have not done so, as this will be important to you in the future with finding out test results, communicating by private email, and scheduling acute appointments online when needed.  Please return in 1 year for your yearly visit, or sooner if needed, with Lab testing done 3-5 days before

## 2013-08-19 NOTE — Assessment & Plan Note (Addendum)

## 2013-08-19 NOTE — Addendum Note (Signed)
Addended by: Scharlene Gloss B on: 08/19/2013 04:01 PM   Modules accepted: Orders

## 2013-08-19 NOTE — Progress Notes (Signed)
Subjective:    Patient ID: Gerald Jenkins, male    DOB: 1951-07-05, 62 y.o.   MRN: [id number]  HPI  Here for wellness and f/u;  Overall doing ok;  Pt denies CP, worsening SOB, DOE, wheezing, orthopnea, PND, worsening LE edema, palpitations, dizziness or syncope.  Pt denies neurological change such as new headache, facial or extremity weakness.  Pt denies polydipsia, polyuria, or low sugar symptoms. Pt states overall good compliance with treatment and medications, good tolerability, and has been trying to follow lower cholesterol diet.  Pt denies worsening depressive symptoms, suicidal ideation or panic. No fever, night sweats, wt loss, loss of appetite, or other constitutional symptoms.  Pt states good ability with ADL's, has low fall risk, home safety reviewed and adequate, no other significant changes in hearing or vision, and only occasionally active with exercise, goes to the gym up to 3-4 times per wk, though some wks only once.  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 ANXIETY 01/19/2008  . Cervicalgia 01/19/2008  . DISORDER OF BONE AND CARTILAGE UNSPECIFIED 01/11/2008  . HYPERLIPIDEMIA 01/19/2008  . PSA, INCREASED 07/12/2010  . RASH-NONVESICULAR 01/19/2008   Past Surgical History  Procedure Laterality Date  . Cataract extraction    . Lumbar disease      reports that he has been smoking.  He does not have any smokeless tobacco history on file. He reports that  drinks alcohol. His drug history is not on file. family history includes Cancer in his other; Diabetes in his other; Lumbar disc disease in his father; Multiple sclerosis in his other. Allergies  Allergen Reactions  . Penicillins    Current Outpatient Prescriptions on File Prior to Visit  Medication Sig Dispense Refill  . aspirin 81 MG tablet Take 81 mg by mouth daily.        . sildenafil (VIAGRA) 100 MG tablet 1 by mouth every other day as needed  10 tablet  11  .  tadalafil (CIALIS) 20 MG tablet 1 by mouth every other day  10 tablet  11   No current facility-administered medications on file prior to visit.   Review of Systems Constitutional: Negative for diaphoresis, activity change, appetite change or unexpected weight change.  HENT: Negative for hearing loss, ear pain, facial swelling, mouth sores and neck stiffness.   Eyes: Negative for pain, redness and visual disturbance.  Respiratory: Negative for shortness of breath and wheezing.   Cardiovascular: Negative for chest pain and palpitations.  Gastrointestinal: Negative for diarrhea, blood in stool, abdominal distention or other pain Genitourinary: Negative for hematuria, flank pain or change in urine volume.  Musculoskeletal: Negative for myalgias and joint swelling.  Skin: Negative for color change and wound.  Neurological: Negative for syncope and numbness. other than noted Hematological: Negative for adenopathy.  Psychiatric/Behavioral: Negative for hallucinations, self-injury, decreased concentration and agitation.      Objective:   Physical Exam BP 122/70  Pulse 81  Temp(Src) 97.2 F (36.2 C) (Oral)  Ht 6' 1.5" (1.867 m)  Wt 182 lb (82.555 kg)  BMI 23.68 kg/m2  SpO2 95% VS noted,  Constitutional: Pt is oriented to person, place, and time. Appears well-developed and well-nourished.  Head: Normocephalic and atraumatic.  Right Ear: External ear normal.  Left Ear: External ear normal.  Nose: Nose normal.  Mouth/Throat: Oropharynx is clear and moist.  Eyes: Conjunctivae and EOM are normal. Pupils are equal, round, and reactive to light.  Neck: Normal range of motion. Neck supple. No JVD present. No tracheal deviation present.  Cardiovascular: Normal rate, regular rhythm, normal heart sounds and intact distal pulses.   Pulmonary/Chest: Effort normal and breath sounds normal.  Abdominal: Soft. Bowel sounds are normal. There is no tenderness. No HSM  Musculoskeletal: Normal range of  motion. Exhibits no edema.  Lymphadenopathy:  Has no cervical adenopathy.  Neurological: Pt is alert and oriented to person, place, and time. Pt has normal reflexes. No cranial nerve deficit.  Skin: Skin is warm and dry. No rash noted.  Psychiatric:  Has  normal mood and affect. Behavior is normal.        Assessment & Plan:

## 2013-08-20 ENCOUNTER — Other Ambulatory Visit: Payer: Self-pay | Admitting: Internal Medicine

## 2013-08-23 ENCOUNTER — Telehealth: Payer: Self-pay | Admitting: *Deleted

## 2013-08-23 MED ORDER — TADALAFIL 20 MG PO TABS: ORAL_TABLET | ORAL | Status: DC

## 2013-08-23 NOTE — Addendum Note (Signed)
Addended by: Corwin Levins on: 08/23/2013 12:33 PM   Modules accepted: Orders

## 2013-08-23 NOTE — Telephone Encounter (Signed)
[facility] Triage Call Report Triage Record Num: [id number] Operator: Maryfrances Bunnell Patient Name: Gerald Jenkins Call Date & Time: 08/20/2013 5:12:03PM Patient Phone: [phone] PCP: Oliver Barre Patient Gender: Male PCP Fax : [phone] Patient DOB: [birth year] Practice Name: [facility] Reason for Call: Caller: Gerald Jenkins/Patient; PCP: Oliver Barre (Adults only); CB#: [phone]; Call regarding ; Yearly office visit 08/19/13; Needs refills for Viagra 100mg  eod; Cialis 20 mg 1 eod; EMR shows pharmacy request for the Viagra but not showing for the Cialis; Patient states he will need the Viagra now but can wait for the Cialis; Viagra 100 mg 1 eod # 8 called to [facility] per Dr. Berniece Andreas oncall MD per standing orders. Patient to f/u with office 9/7 for further refills of the medications for the year. Protocol(s) Used: Office Note Recommended Outcome per Protocol: Information Noted and Sent to Office Reason for Outcome: Caller information to office Care Advice: ~ 09/

## 2013-08-23 NOTE — Telephone Encounter (Signed)
Both had been done recently, re-done today

## 2013-10-01 ENCOUNTER — Encounter: Payer: Self-pay | Admitting: Internal Medicine

## 2013-11-04 ENCOUNTER — Other Ambulatory Visit: Payer: Self-pay | Admitting: Internal Medicine

## 2014-08-23 ENCOUNTER — Telehealth: Payer: Self-pay

## 2014-08-23 ENCOUNTER — Other Ambulatory Visit (INDEPENDENT_AMBULATORY_CARE_PROVIDER_SITE_OTHER): Payer: BC Managed Care – PPO

## 2014-08-23 DIAGNOSIS — Z0000 Encounter for general adult medical examination without abnormal findings: Secondary | ICD-10-CM

## 2014-08-23 LAB — CBC WITH DIFFERENTIAL/PLATELET
BASOS ABS: 0 10*3/uL (ref 0.0–0.1)
BASOS PCT: 0.6 % (ref 0.0–3.0)
EOS ABS: 0.2 10*3/uL (ref 0.0–0.7)
Eosinophils Relative: 2.9 % (ref 0.0–5.0)
HCT: 40 % (ref 39.0–52.0)
HEMOGLOBIN: 13.8 g/dL (ref 13.0–17.0)
LYMPHS PCT: 31 % (ref 12.0–46.0)
Lymphs Abs: 2.2 10*3/uL (ref 0.7–4.0)
MCHC: 34.5 g/dL (ref 30.0–36.0)
MCV: 90.9 fl (ref 78.0–100.0)
MONO ABS: 0.7 10*3/uL (ref 0.1–1.0)
Monocytes Relative: 9.2 % (ref 3.0–12.0)
NEUTROS ABS: 4 10*3/uL (ref 1.4–7.7)
NEUTROS PCT: 56.3 % (ref 43.0–77.0)
Platelets: 255 10*3/uL (ref 150.0–400.0)
RBC: 4.4 Mil/uL (ref 4.22–5.81)
RDW: 13.6 % (ref 11.5–15.5)
WBC: 7.2 10*3/uL (ref 4.0–10.5)

## 2014-08-23 LAB — BASIC METABOLIC PANEL
BUN: 13 mg/dL (ref 6–23)
CHLORIDE: 104 meq/L (ref 96–112)
CO2: 29 meq/L (ref 19–32)
Calcium: 9.3 mg/dL (ref 8.4–10.5)
Creatinine, Ser: 0.9 mg/dL (ref 0.4–1.5)
GFR: 93.18 mL/min (ref >60.00)
Glucose, Bld: 80 mg/dL (ref 70–99)
Potassium: 4.7 mEq/L (ref 3.5–5.1)
SODIUM: 140 meq/L (ref 135–145)

## 2014-08-23 LAB — URINALYSIS, ROUTINE W REFLEX MICROSCOPIC
Bilirubin Urine: NEGATIVE
Hgb urine dipstick: NEGATIVE
Ketones, ur: NEGATIVE
LEUKOCYTES UA: NEGATIVE
Nitrite: NEGATIVE
PH: 6 (ref 5.0–8.0)
Specific Gravity, Urine: 1.015 (ref 1.000–1.030)
TOTAL PROTEIN, URINE-UPE24: NEGATIVE
Urine Glucose: NEGATIVE
Urobilinogen, UA: 0.2 (ref 0.0–1.0)

## 2014-08-23 LAB — LIPID PANEL
CHOL/HDL RATIO: 2
Cholesterol: 177 mg/dL (ref 0–200)
HDL: 71.4 mg/dL (ref >39.00)
LDL Cholesterol: 87 mg/dL (ref 0–99)
NonHDL: 105.6
Triglycerides: 94 mg/dL (ref 0.0–149.0)
VLDL: 18.8 mg/dL (ref 0.0–40.0)

## 2014-08-23 LAB — TSH: TSH: 2.93 u[IU]/mL (ref 0.35–4.50)

## 2014-08-23 LAB — PSA: PSA: 0.9 ng/mL (ref 0.10–4.00)

## 2014-08-23 NOTE — Telephone Encounter (Signed)
Labs entered.

## 2014-08-25 ENCOUNTER — Encounter: Payer: Self-pay | Admitting: Internal Medicine

## 2014-08-25 ENCOUNTER — Ambulatory Visit (INDEPENDENT_AMBULATORY_CARE_PROVIDER_SITE_OTHER): Payer: BC Managed Care – PPO | Admitting: Internal Medicine

## 2014-08-25 VITALS — BP 122/82 | HR 71 | Temp 98.5°F | Ht 73.0 in | Wt 178.8 lb

## 2014-08-25 DIAGNOSIS — Z0000 Encounter for general adult medical examination without abnormal findings: Secondary | ICD-10-CM

## 2014-08-25 MED ORDER — TADALAFIL 20 MG PO TABS: ORAL_TABLET | ORAL | Status: DC

## 2014-08-25 MED ORDER — SILDENAFIL CITRATE 100 MG PO TABS: ORAL_TABLET | ORAL | Status: DC

## 2014-08-25 MED ORDER — INDOMETHACIN 50 MG PO CAPS: 50.0000 mg | ORAL_CAPSULE | Freq: Three times a day (TID) | ORAL | Status: DC | PRN

## 2014-08-25 MED ORDER — ROSUVASTATIN CALCIUM 40 MG PO TABS: ORAL_TABLET | ORAL | Status: DC

## 2014-08-25 MED ORDER — INDOMETHACIN 50 MG PO CAPS: 50.0000 mg | ORAL_CAPSULE | Freq: Two times a day (BID) | ORAL | Status: DC

## 2014-08-25 NOTE — Progress Notes (Signed)
Pre visit review using our clinic review tool, if applicable. No additional management support is needed unless otherwise documented below in the visit note. 

## 2014-08-25 NOTE — Patient Instructions (Addendum)
Please remember to get your flu shot at work in Oct as you plan  You will be contacted regarding the referral for: colonoscopy  Please take all new medication as prescribed - the indocin as needed (sent to pharmacy)  Please continue all other medications as before, and refills have been done if requested (crestor to the pharmacy, and the others in written prescriptions)  Please have the pharmacy call with any other refills you may need.  Please continue your efforts at being more active, low cholesterol diet, and weight control.  You are otherwise up to date with prevention measures today.  Please keep your appointments with your specialists as you may have planned  Your Lab work was [place] today  Please return in 1 year for your yearly visit, or sooner if needed, with Lab testing done 3-5 days before

## 2014-08-25 NOTE — Assessment & Plan Note (Signed)

## 2014-08-25 NOTE — Progress Notes (Signed)
Subjective:    Patient ID: Gerald Jenkins, male    DOB: 11-20-1951, 63 y.o.   MRN: [id number]  HPI  Here for wellness and f/u;  Overall doing ok;  Pt denies CP, worsening SOB, DOE, wheezing, orthopnea, PND, worsening LE edema, palpitations, dizziness or syncope.  Pt denies neurological change such as new headache, facial or extremity weakness.  Pt denies polydipsia, polyuria, or low sugar symptoms. Pt states overall good compliance with treatment and medications, good tolerability, and has been trying to follow lower cholesterol diet.  Pt denies worsening depressive symptoms, suicidal ideation or panic. No fever, night sweats, wt loss, loss of appetite, or other constitutional symptoms.  Pt states good ability with ADL's, has low fall risk, home safety reviewed and adequate, no other significant changes in hearing or vision, and only occasionally active with exercise. Has had gout twice this yr, better with indocin recently per telemedcine MD at work. Friend attorney died of lung ca in 52 wks yesterday, he is now willing to do colonscopy Past Medical History  Diagnosis Date  . ANXIETY 01/19/2008  . Cervicalgia 01/19/2008  . DISORDER OF BONE AND CARTILAGE UNSPECIFIED 01/11/2008  . HYPERLIPIDEMIA 01/19/2008  . PSA, INCREASED 07/12/2010  . RASH-NONVESICULAR 01/19/2008   Past Surgical History  Procedure Laterality Date  . Cataract extraction    . Lumbar disease      reports that he has been smoking.  He does not have any smokeless tobacco history on file. He reports that he drinks alcohol. His drug history is not on file. family history includes Cancer in his other; Diabetes in his other; Lumbar disc disease in his father; Multiple sclerosis in his other. Allergies  Allergen Reactions  . Penicillins    Current Outpatient Prescriptions on File Prior to Visit  Medication Sig Dispense Refill  . aspirin 81 MG tablet Take 81 mg by mouth daily.         No current facility-administered medications on file  prior to visit.   Review of Systems Constitutional: Negative for increased diaphoresis, other activity, appetite or other siginficant weight change  HENT: Negative for worsening hearing loss, ear pain, facial swelling, mouth sores and neck stiffness.   Eyes: Negative for other worsening pain, redness or visual disturbance.  Respiratory: Negative for shortness of breath and wheezing.   Cardiovascular: Negative for chest pain and palpitations.  Gastrointestinal: Negative for diarrhea, blood in stool, abdominal distention or other pain Genitourinary: Negative for hematuria, flank pain or change in urine volume.  Musculoskeletal: Negative for myalgias or other joint complaints.  Skin: Negative for color change and wound.  Neurological: Negative for syncope and numbness. other than noted Hematological: Negative for adenopathy. or other swelling Psychiatric/Behavioral: Negative for hallucinations, self-injury, decreased concentration or other worsening agitation.      Objective:   Physical Exam BP 122/82  Pulse 71  Temp(Src) 98.5 F (36.9 C) (Oral)  Ht 6\' 1"  (1.854 m)  Wt 178 lb 12 oz (81.08 kg)  BMI 23.59 kg/m2  SpO2 96% VS noted,  Constitutional: Pt is oriented to person, place, and time. Appears well-developed and well-nourished.  Head: Normocephalic and atraumatic.  Right Ear: External ear normal.  Left Ear: External ear normal.  Nose: Nose normal.  Mouth/Throat: Oropharynx is clear and moist.  Eyes: Conjunctivae and EOM are normal. Pupils are equal, round, and reactive to light.  Neck: Normal range of motion. Neck supple. No JVD present. No tracheal deviation present.  Cardiovascular: Normal rate, regular rhythm, normal  heart sounds and intact distal pulses.   Pulmonary/Chest: Effort normal and breath sounds without rales or wheezing  Abdominal: Soft. Bowel sounds are normal. NT. No HSM  Musculoskeletal: Normal range of motion. Exhibits no edema.  Lymphadenopathy:  Has no  cervical adenopathy.  Neurological: Pt is alert and oriented to person, place, and time. Pt has normal reflexes. No cranial nerve deficit. Motor grossly intact Skin: Skin is warm and dry. No rash noted.  Psychiatric:  Has normal mood and affect. Behavior is normal.     Assessment & Plan:   Wt Readings from Last 3 Encounters:  08/25/14 178 lb 12 oz (81.08 kg)  08/19/13 182 lb (82.555 kg)  08/13/12 175 lb 4 oz (79.493 kg)

## 2014-09-30 ENCOUNTER — Encounter: Payer: Self-pay | Admitting: Internal Medicine

## 2014-10-06 ENCOUNTER — Encounter: Payer: Self-pay | Admitting: Internal Medicine

## 2015-08-29 ENCOUNTER — Encounter: Payer: BC Managed Care – PPO | Admitting: Internal Medicine

## 2015-09-05 ENCOUNTER — Other Ambulatory Visit (INDEPENDENT_AMBULATORY_CARE_PROVIDER_SITE_OTHER): Payer: BLUE CROSS/BLUE SHIELD

## 2015-09-05 DIAGNOSIS — Z0000 Encounter for general adult medical examination without abnormal findings: Secondary | ICD-10-CM

## 2015-09-05 LAB — CBC WITH DIFFERENTIAL/PLATELET
BASOS PCT: 0.4 % (ref 0.0–3.0)
Basophils Absolute: 0 10*3/uL (ref 0.0–0.1)
EOS PCT: 2.8 % (ref 0.0–5.0)
Eosinophils Absolute: 0.3 10*3/uL (ref 0.0–0.7)
HEMATOCRIT: 43.7 % (ref 39.0–52.0)
HEMOGLOBIN: 14.7 g/dL (ref 13.0–17.0)
Lymphocytes Relative: 26.7 % (ref 12.0–46.0)
Lymphs Abs: 2.5 10*3/uL (ref 0.7–4.0)
MCHC: 33.7 g/dL (ref 30.0–36.0)
MCV: 90.8 fl (ref 78.0–100.0)
MONO ABS: 0.8 10*3/uL (ref 0.1–1.0)
MONOS PCT: 8.3 % (ref 3.0–12.0)
Neutro Abs: 5.9 10*3/uL (ref 1.4–7.7)
Neutrophils Relative %: 61.8 % (ref 43.0–77.0)
Platelets: 294 10*3/uL (ref 150.0–400.0)
RBC: 4.81 Mil/uL (ref 4.22–5.81)
RDW: 13.5 % (ref 11.5–15.5)
WBC: 9.6 10*3/uL (ref 4.0–10.5)

## 2015-09-05 LAB — URINALYSIS, ROUTINE W REFLEX MICROSCOPIC
BILIRUBIN URINE: NEGATIVE
Ketones, ur: NEGATIVE
LEUKOCYTES UA: NEGATIVE
NITRITE: NEGATIVE
Specific Gravity, Urine: 1.02 (ref 1.000–1.030)
TOTAL PROTEIN, URINE-UPE24: NEGATIVE
Urine Glucose: NEGATIVE
Urobilinogen, UA: 0.2 (ref 0.0–1.0)
pH: 6 (ref 5.0–8.0)

## 2015-09-05 LAB — BASIC METABOLIC PANEL
BUN: 19 mg/dL (ref 6–23)
CALCIUM: 9.6 mg/dL (ref 8.4–10.5)
CO2: 29 mEq/L (ref 19–32)
Chloride: 104 mEq/L (ref 96–112)
Creatinine, Ser: 1.06 mg/dL (ref 0.40–1.50)
GFR: 74.92 mL/min (ref >60.00)
GLUCOSE: 88 mg/dL (ref 70–99)
POTASSIUM: 4.7 meq/L (ref 3.5–5.1)
SODIUM: 142 meq/L (ref 135–145)

## 2015-09-05 LAB — LIPID PANEL
Cholesterol: 183 mg/dL (ref 0–200)
HDL: 66.6 mg/dL (ref >39.00)
LDL Cholesterol: 102 mg/dL — ABNORMAL HIGH (ref 0–99)
NONHDL: 116.61
Total CHOL/HDL Ratio: 3
Triglycerides: 74 mg/dL (ref 0.0–149.0)
VLDL: 14.8 mg/dL (ref 0.0–40.0)

## 2015-09-05 LAB — PSA: PSA: 0.75 ng/mL (ref 0.10–4.00)

## 2015-09-05 LAB — HEPATIC FUNCTION PANEL
ALBUMIN: 4.5 g/dL (ref 3.5–5.2)
ALK PHOS: 45 U/L (ref 39–117)
ALT: 20 U/L (ref 0–53)
AST: 18 U/L (ref 0–37)
BILIRUBIN TOTAL: 0.3 mg/dL (ref 0.2–1.2)
Bilirubin, Direct: 0.1 mg/dL (ref 0.0–0.3)
Total Protein: 7.5 g/dL (ref 6.0–8.3)

## 2015-09-05 LAB — TSH: TSH: 4.05 u[IU]/mL (ref 0.35–4.50)

## 2015-09-07 ENCOUNTER — Ambulatory Visit (INDEPENDENT_AMBULATORY_CARE_PROVIDER_SITE_OTHER): Payer: BLUE CROSS/BLUE SHIELD | Admitting: Internal Medicine

## 2015-09-07 ENCOUNTER — Encounter: Payer: Self-pay | Admitting: Internal Medicine

## 2015-09-07 VITALS — BP 120/80 | HR 72 | Temp 98.3°F | Ht 73.0 in | Wt 177.0 lb

## 2015-09-07 DIAGNOSIS — M19041 Primary osteoarthritis, right hand: Secondary | ICD-10-CM | POA: Diagnosis not present

## 2015-09-07 DIAGNOSIS — M19042 Primary osteoarthritis, left hand: Secondary | ICD-10-CM | POA: Diagnosis not present

## 2015-09-07 DIAGNOSIS — Z23 Encounter for immunization: Secondary | ICD-10-CM

## 2015-09-07 DIAGNOSIS — Z0000 Encounter for general adult medical examination without abnormal findings: Secondary | ICD-10-CM | POA: Diagnosis not present

## 2015-09-07 MED ORDER — ROSUVASTATIN CALCIUM 40 MG PO TABS: ORAL_TABLET | ORAL | Status: DC

## 2015-09-07 MED ORDER — DICLOFENAC SODIUM 1 % TD GEL: 4.0000 g | Freq: Four times a day (QID) | TRANSDERMAL | Status: DC

## 2015-09-07 MED ORDER — SILDENAFIL CITRATE 100 MG PO TABS: ORAL_TABLET | ORAL | Status: DC

## 2015-09-07 NOTE — Progress Notes (Signed)
Pre visit review using our clinic review tool, if applicable. No additional management support is needed unless otherwise documented below in the visit note. 

## 2015-09-07 NOTE — Assessment & Plan Note (Signed)

## 2015-09-07 NOTE — Patient Instructions (Addendum)
You had the flu shot today  Please take all new medication as prescribed - the voltaren gel  Please continue all other medications as before, and refills have been done if requested.  Please have the pharmacy call with any other refills you may need.  Please continue your efforts at being more active, low cholesterol diet, and weight control.  You are otherwise up to date with prevention measures today.  Please keep your appointments with your specialists as you may have planned  You will be contacted regarding the referral for: colonoscopy  Please return in 1 year for your yearly visit, or sooner if needed, with Lab testing done 3-5 days before

## 2015-09-07 NOTE — Progress Notes (Signed)
Subjective:    Patient ID: Gerald Jenkins, male    DOB: 06/25/1951, 64 y.o.   MRN: [id number]  HPI  Here for wellness and f/u;  Overall doing ok;  Pt denies Chest pain, worsening SOB, DOE, wheezing, orthopnea, PND, worsening LE edema, palpitations, dizziness or syncope.  Pt denies neurological change such as new headache, facial or extremity weakness.  Pt denies polydipsia, polyuria, or low sugar symptoms. Pt states overall good compliance with treatment and medications, good tolerability, and has been trying to follow appropriate diet.  Pt denies worsening depressive symptoms, suicidal ideation or panic. No fever, night sweats, wt loss, loss of appetite, or other constitutional symptoms.  Pt states good ability with ADL's, has low fall risk, home safety reviewed and adequate, no other significant changes in hearing or vision, and only occasionally active with exercise. S/p recent melanoma to left temple, has f/u oct 6 with dermatology.  Has mild OA changes with stiffness and pain to the hands Past Medical History  Diagnosis Date  . ANXIETY 01/19/2008  . Cervicalgia 01/19/2008  . DISORDER OF BONE AND CARTILAGE UNSPECIFIED 01/11/2008  . HYPERLIPIDEMIA 01/19/2008  . PSA, INCREASED 07/12/2010  . RASH-NONVESICULAR 01/19/2008  . Melanoma 09/09/2015  . 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 Cataract extraction    . Lumbar disease      reports that he has been smoking.  He does not have any smokeless tobacco history on file. He reports that he drinks alcohol. His drug history is not on file. family history includes Cancer in his other; Diabetes in his other; Lumbar disc disease in his father; Multiple sclerosis in his other. Allergies  Allergen Reactions  . Penicillins    Current Outpatient Prescriptions on File Prior to Visit  Medication Sig Dispense Refill  . aspirin 81 MG tablet Take 81 mg by mouth daily.      . indomethacin (INDOCIN) 50 MG capsule Take 1  capsule (50 mg total) by mouth 3 (three) times daily as needed. 60 capsule 2  . tadalafil (CIALIS) 20 MG tablet 1 by mouth every other day 10 tablet 11   No current facility-administered medications on file prior to visit.    Review of Systems Constitutional: Negative for increased diaphoresis, other activity, appetite or siginficant weight change other than noted HENT: Negative for worsening hearing loss, ear pain, facial swelling, mouth sores and neck stiffness.   Eyes: Negative for other worsening pain, redness or visual disturbance.  Respiratory: Negative for shortness of breath and wheezing  Cardiovascular: Negative for chest pain and palpitations.  Gastrointestinal: Negative for diarrhea, blood in stool, abdominal distention or other pain Genitourinary: Negative for hematuria, flank pain or change in urine volume.  Musculoskeletal: Negative for myalgias or other joint complaints.  Skin: Negative for color change and wound or drainage.  Neurological: Negative for syncope and numbness. other than noted Hematological: Negative for adenopathy. or other swelling Psychiatric/Behavioral: Negative for hallucinations, SI, self-injury, decreased concentration or other worsening agitation.      Objective:   Physical Exam BP 120/80 mmHg  Pulse 72  Temp(Src) 98.3 F (36.8 C) (Oral)  Ht 6\' 1"  (1.854 m)  Wt 177 lb (80.287 kg)  BMI 23.36 kg/m2  SpO2 97% VS noted,  Constitutional: Pt is oriented to person, place, and time. Appears well-developed and well-nourished, in no significant distress Head: Normocephalic and atraumatic.  Right Ear: External ear normal.  Left Ear: External ear normal.  Nose: Nose  normal.  Mouth/Throat: Oropharynx is clear and moist.  Eyes: Conjunctivae and EOM are normal. Pupils are equal, round, and reactive to light.  Neck: Normal range of motion. Neck supple. No JVD present. No tracheal deviation present or significant neck LA or mass Cardiovascular: Normal  rate, regular rhythm, normal heart sounds and intact distal pulses.   Pulmonary/Chest: Effort normal and breath sounds without rales or wheezing  Abdominal: Soft. Bowel sounds are normal. NT. No HSM  Musculoskeletal: Normal range of motion. Exhibits no edema.  Lymphadenopathy:  Has no cervical adenopathy.  Neurological: Pt is alert and oriented to person, place, and time. Pt has normal reflexes. No cranial nerve deficit. Motor grossly intact Skin: Skin is warm and dry. No rash noted.  Psychiatric:  Has normal mood and affect. Behavior is normal.  Hand DIP and PIP OA changes noted    Assessment & Plan:

## 2015-09-09 ENCOUNTER — Encounter: Payer: Self-pay | Admitting: Internal Medicine

## 2015-09-09 DIAGNOSIS — M19049 Primary osteoarthritis, unspecified hand: Secondary | ICD-10-CM

## 2015-09-09 DIAGNOSIS — C439 Malignant melanoma of skin, unspecified: Secondary | ICD-10-CM

## 2015-09-09 HISTORY — DX: Primary osteoarthritis, unspecified hand: M19.049

## 2015-09-09 HISTORY — DX: Malignant melanoma of skin, unspecified: C43.9

## 2015-09-09 NOTE — Assessment & Plan Note (Signed)
For voltaren gel,  to f/u any worsening symptoms or concerns

## 2015-09-22 ENCOUNTER — Other Ambulatory Visit: Payer: Self-pay | Admitting: Internal Medicine

## 2015-09-28 ENCOUNTER — Encounter: Payer: Self-pay | Admitting: Internal Medicine

## 2016-07-19 ENCOUNTER — Other Ambulatory Visit: Payer: Self-pay | Admitting: Internal Medicine

## 2016-08-09 ENCOUNTER — Other Ambulatory Visit: Payer: Self-pay | Admitting: Internal Medicine

## 2016-08-09 DIAGNOSIS — Z0001 Encounter for general adult medical examination with abnormal findings: Secondary | ICD-10-CM

## 2016-09-30 ENCOUNTER — Telehealth: Payer: Self-pay | Admitting: Internal Medicine

## 2016-09-30 NOTE — Telephone Encounter (Signed)
Patient requesting updated labs to be entered for CPE on Wed.  Please follow up with patient in regard.

## 2016-10-02 ENCOUNTER — Ambulatory Visit (INDEPENDENT_AMBULATORY_CARE_PROVIDER_SITE_OTHER): Payer: BLUE CROSS/BLUE SHIELD | Admitting: Internal Medicine

## 2016-10-02 ENCOUNTER — Encounter: Payer: Self-pay | Admitting: Internal Medicine

## 2016-10-02 VITALS — BP 138/78 | HR 84 | Temp 98.5°F | Resp 20 | Wt 180.4 lb

## 2016-10-02 DIAGNOSIS — Z0000 Encounter for general adult medical examination without abnormal findings: Secondary | ICD-10-CM | POA: Diagnosis not present

## 2016-10-02 DIAGNOSIS — Z1159 Encounter for screening for other viral diseases: Secondary | ICD-10-CM | POA: Diagnosis not present

## 2016-10-02 DIAGNOSIS — R1904 Left lower quadrant abdominal swelling, mass and lump: Secondary | ICD-10-CM

## 2016-10-02 DIAGNOSIS — Z1211 Encounter for screening for malignant neoplasm of colon: Secondary | ICD-10-CM | POA: Diagnosis not present

## 2016-10-02 DIAGNOSIS — Z23 Encounter for immunization: Secondary | ICD-10-CM

## 2016-10-02 NOTE — Progress Notes (Signed)
Subjective:    Patient ID: Gerald Jenkins, male    DOB: [birth year], 65 y.o.   MRN: [id number]  HPI  Here for wellness and f/u;  Overall doing ok;  Pt denies Chest pain, worsening SOB, DOE, wheezing, orthopnea, PND, worsening LE edema, palpitations, dizziness or syncope.  Pt denies neurological change such as new headache, facial or extremity weakness.  Pt denies polydipsia, polyuria, or low sugar symptoms. Pt states overall good compliance with treatment and medications, good tolerability, and has been trying to follow appropriate diet.  Pt denies worsening depressive symptoms, suicidal ideation or panic. No fever, night sweats, wt loss, loss of appetite, or other constitutional symptoms.  Pt states good ability with ADL's, has low fall risk, home safety reviewed and adequate, no other significant changes in hearing or vision, and only occasionally active with exercise. Hx essentially no change from previous.  Has gained several lbs.  Wt Readings from Last 3 Encounters:  10/02/16 180 lb 6 oz (81.8 kg)  09/07/15 177 lb (80.3 kg)  08/25/14 178 lb 12 oz (81.1 kg)  Denies worsening reflux, abd pain, dysphagia, n/v, bowel change or blood. Denies urinary symptoms such as dysuria, frequency, urgency, flank pain, hematuria or n/v, fever, chills. Past Medical History:  Diagnosis Date  . ANXIETY 01/19/2008  . Cervicalgia 01/19/2008  . DISORDER OF BONE AND CARTILAGE UNSPECIFIED 01/11/2008  . HYPERLIPIDEMIA 01/19/2008  . Melanoma ([place]) 09/09/2015  . 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 PSA, INCREASED 07/12/2010  . RASH-NONVESICULAR 01/19/2008   Past Surgical History:  Procedure Laterality Date  . CATARACT EXTRACTION    . lumbar disease      reports that he has been smoking.  He does not have any smokeless tobacco history on file. He reports that he drinks alcohol. His drug history is not on file. family history includes Cancer in his other; Diabetes in his other; Lumbar disc disease in his father; Multiple  sclerosis in his other. Allergies  Allergen Reactions  . Penicillins    Current Outpatient Prescriptions on File Prior to Visit  Medication Sig Dispense Refill  . aspirin 81 MG tablet Take 81 mg by mouth daily.      . CRESTOR 40 MG tablet TAKE 1/2 TABLET BY MOUTH ONCE DAILY 45 tablet 1  . indomethacin (INDOCIN) 50 MG capsule Take 1 capsule (50 mg total) by mouth 3 (three) times daily as needed. 60 capsule 2  . sildenafil (VIAGRA) 100 MG tablet TAKE 1 TABLET BY MOUTH EVERY OTHER DAY AS NEEDED 10 tablet 11  . tadalafil (CIALIS) 20 MG tablet 1 by mouth every other day 10 tablet 11   No current facility-administered medications on file prior to visit.    Review of Systems Constitutional: Negative for increased diaphoresis, or other activity, appetite or siginficant weight change other than noted HENT: Negative for worsening hearing loss, ear pain, facial swelling, mouth sores and neck stiffness.   Eyes: Negative for other worsening pain, redness or visual disturbance.  Respiratory: Negative for choking or stridor Cardiovascular: Negative for other chest pain and palpitations.  Gastrointestinal: Negative for worsening diarrhea, blood in stool, or abdominal distention Genitourinary: Negative for hematuria, flank pain or change in urine volume.  Musculoskeletal: Negative for myalgias or other joint complaints.  Skin: Negative for other color change and wound or drainage.  Neurological: Negative for syncope and numbness. other than noted Hematological: Negative for adenopathy. or other swelling Psychiatric/Behavioral: Negative for hallucinations, SI, self-injury, decreased concentration or other worsening agitation.  All  o/w neg per pt    Objective:   Physical Exam BP 138/78   Pulse 84   Temp 98.5 F (36.9 C) (Oral)   Resp 20   Wt 180 lb 6 oz (81.8 kg)   SpO2 98%   BMI 23.80 kg/m  VS noted,  Constitutional: Pt is oriented to person, place, and time. Appears well-developed and  well-nourished, in no significant distress Head: Normocephalic and atraumatic  Eyes: Conjunctivae and EOM are normal. Pupils are equal, round, and reactive to light Right Ear: External ear normal.  Left Ear: External ear normal Nose: Nose normal.  Mouth/Throat: Oropharynx is clear and moist  Neck: Normal range of motion. Neck supple. No JVD present. No tracheal deviation present or significant neck LA or mass Cardiovascular: Normal rate, regular rhythm, normal heart sounds and intact distal pulses.   Pulmonary/Chest: Effort normal and breath sounds without rales or wheezing  Abdominal: Soft. Bowel sounds are normal. NT. No HSM but has an unusual LLQ firmness/mass I estimate > 1 cm but nondiscrete edges, NT to palpate, some mobile Musculoskeletal: Normal range of motion. Exhibits no edema Lymphadenopathy: Has no cervical adenopathy.  Neurological: Pt is alert and oriented to person, place, and time. Pt has normal reflexes. No cranial nerve deficit. Motor grossly intact Skin: Skin is warm and dry. No rash noted or new ulcers Psychiatric:  Has normal mood and affect. Behavior is normal.     Assessment & Plan:

## 2016-10-02 NOTE — Progress Notes (Signed)
Pre visit review using our clinic review tool, if applicable. No additional management support is needed unless otherwise documented below in the visit note. 

## 2016-10-02 NOTE — Patient Instructions (Addendum)
You had the flu shot today  Please continue all other medications as before, and refills have been done if requested.  Please have the pharmacy call with any other refills you may need.  Please continue your efforts at being more active, low cholesterol diet, and weight control.  You are otherwise up to date with prevention measures today.  Please keep your appointments with your specialists as you may have planned  Please make return visit for brief check only with me on Friday   You will be contacted regarding the referral for: colonoscopy  /Please go to the LAB in the Basement (turn left off the elevator) for the tests to be done at your convenience  You will be contacted by phone if any changes need to be made immediately.  Otherwise, you will receive a letter about your results with an explanation, but please check with MyChart first.  Please remember to sign up for MyChart if you have not done so, as this will be important to you in the future with finding out test results, communicating by private email, and scheduling acute appointments online when needed.  Please return in 1 year for your yearly visit, or sooner if needed, with Lab testing done 3-5 days before

## 2016-10-04 ENCOUNTER — Other Ambulatory Visit (INDEPENDENT_AMBULATORY_CARE_PROVIDER_SITE_OTHER): Payer: BLUE CROSS/BLUE SHIELD

## 2016-10-04 ENCOUNTER — Ambulatory Visit: Payer: BLUE CROSS/BLUE SHIELD

## 2016-10-04 DIAGNOSIS — Z0000 Encounter for general adult medical examination without abnormal findings: Secondary | ICD-10-CM | POA: Diagnosis not present

## 2016-10-04 LAB — URINALYSIS, ROUTINE W REFLEX MICROSCOPIC
Bilirubin Urine: NEGATIVE
KETONES UR: NEGATIVE
Leukocytes, UA: NEGATIVE
Nitrite: NEGATIVE
PH: 6 (ref 5.0–8.0)
SPECIFIC GRAVITY, URINE: 1.01 (ref 1.000–1.030)
Total Protein, Urine: NEGATIVE
URINE GLUCOSE: NEGATIVE
UROBILINOGEN UA: 0.2 (ref 0.0–1.0)

## 2016-10-04 LAB — HEPATIC FUNCTION PANEL
ALT: 17 U/L (ref 0–53)
AST: 16 U/L (ref 0–37)
Albumin: 4.5 g/dL (ref 3.5–5.2)
Alkaline Phosphatase: 41 U/L (ref 39–117)
BILIRUBIN DIRECT: 0 mg/dL (ref 0.0–0.3)
TOTAL PROTEIN: 7.3 g/dL (ref 6.0–8.3)
Total Bilirubin: 0.2 mg/dL (ref 0.2–1.2)

## 2016-10-04 LAB — BASIC METABOLIC PANEL
BUN: 17 mg/dL (ref 6–23)
CHLORIDE: 105 meq/L (ref 96–112)
CO2: 31 meq/L (ref 19–32)
CREATININE: 1.05 mg/dL (ref 0.40–1.50)
Calcium: 9.7 mg/dL (ref 8.4–10.5)
GFR: 75.49 mL/min (ref >60.00)
GLUCOSE: 83 mg/dL (ref 70–99)
Potassium: 4.7 mEq/L (ref 3.5–5.1)
Sodium: 142 mEq/L (ref 135–145)

## 2016-10-04 LAB — TSH: TSH: 4.25 u[IU]/mL (ref 0.35–4.50)

## 2016-10-04 LAB — CBC WITH DIFFERENTIAL/PLATELET
BASOS ABS: 0.1 10*3/uL (ref 0.0–0.1)
Basophils Relative: 0.9 % (ref 0.0–3.0)
EOS ABS: 0.2 10*3/uL (ref 0.0–0.7)
Eosinophils Relative: 2.8 % (ref 0.0–5.0)
HCT: 41.7 % (ref 39.0–52.0)
Hemoglobin: 14.2 g/dL (ref 13.0–17.0)
LYMPHS ABS: 2.2 10*3/uL (ref 0.7–4.0)
LYMPHS PCT: 28.1 % (ref 12.0–46.0)
MCHC: 34 g/dL (ref 30.0–36.0)
MCV: 89.6 fl (ref 78.0–100.0)
Monocytes Absolute: 0.9 10*3/uL (ref 0.1–1.0)
Monocytes Relative: 11 % (ref 3.0–12.0)
NEUTROS ABS: 4.5 10*3/uL (ref 1.4–7.7)
NEUTROS PCT: 57.2 % (ref 43.0–77.0)
PLATELETS: 240 10*3/uL (ref 150.0–400.0)
RBC: 4.66 Mil/uL (ref 4.22–5.81)
RDW: 13.1 % (ref 11.5–15.5)
WBC: 7.8 10*3/uL (ref 4.0–10.5)

## 2016-10-04 LAB — LIPID PANEL
CHOLESTEROL: 192 mg/dL (ref 0–200)
HDL: 60.4 mg/dL (ref >39.00)
LDL Cholesterol: 97 mg/dL (ref 0–99)
NONHDL: 131.48
TRIGLYCERIDES: 172 mg/dL — AB (ref 0.0–149.0)
Total CHOL/HDL Ratio: 3
VLDL: 34.4 mg/dL (ref 0.0–40.0)

## 2016-10-04 LAB — PSA: PSA: 0.76 ng/mL (ref 0.10–4.00)

## 2016-10-06 DIAGNOSIS — R1904 Left lower quadrant abdominal swelling, mass and lump: Secondary | ICD-10-CM | POA: Insufficient documentation

## 2016-10-06 NOTE — Assessment & Plan Note (Signed)

## 2016-10-06 NOTE — Assessment & Plan Note (Addendum)
NT, mobile, unsusual, I suspect likely related asympt/palpable stool within a viscus but cant r/o other such as malignancy; may not need CT and no other GI symptoms.  I have simply asked pt to return for nurse visit in 2 days for me to re-exam, with eye toward CT if persistent abnormal.

## 2016-10-08 ENCOUNTER — Ambulatory Visit: Payer: BLUE CROSS/BLUE SHIELD | Admitting: Internal Medicine

## 2016-10-15 ENCOUNTER — Ambulatory Visit: Payer: BLUE CROSS/BLUE SHIELD | Admitting: Internal Medicine

## 2016-10-18 ENCOUNTER — Ambulatory Visit: Payer: BLUE CROSS/BLUE SHIELD | Admitting: Internal Medicine

## 2016-10-28 ENCOUNTER — Other Ambulatory Visit: Payer: Self-pay | Admitting: Internal Medicine

## 2016-11-12 ENCOUNTER — Encounter: Payer: Self-pay | Admitting: Internal Medicine

## 2017-02-11 ENCOUNTER — Other Ambulatory Visit: Payer: Self-pay | Admitting: Internal Medicine

## 2017-02-17 ENCOUNTER — Telehealth: Payer: Self-pay | Admitting: Internal Medicine

## 2017-02-17 NOTE — Telephone Encounter (Signed)
Ok to ask if he still wants rx for viagra, which is now generic (but still costlym, just maybe not as much).

## 2017-02-17 NOTE — Telephone Encounter (Signed)
Called pt about Rx Viagra been denied....please advice

## 2017-02-20 NOTE — Telephone Encounter (Signed)
Rec'd call from pt insurance plan stating pt has reach out to them trying to get a PA on pt Viagra. Rep stated PA was faxed twice, and depending on records med may be covered. Inform rep to refax PA to my attention @ [phone] and we can start process today...Johny Chess

## 2017-02-20 NOTE — Telephone Encounter (Signed)
Rec'd PA completed and faxed back to insurance waiting on approval status...Johny Chess

## 2017-08-04 ENCOUNTER — Other Ambulatory Visit: Payer: Self-pay | Admitting: Internal Medicine

## 2017-11-11 ENCOUNTER — Ambulatory Visit: Payer: BLUE CROSS/BLUE SHIELD | Admitting: Internal Medicine

## 2017-11-11 ENCOUNTER — Encounter (INDEPENDENT_AMBULATORY_CARE_PROVIDER_SITE_OTHER): Payer: BLUE CROSS/BLUE SHIELD | Admitting: Ophthalmology

## 2017-11-11 DIAGNOSIS — H353122 Nonexudative age-related macular degeneration, left eye, intermediate dry stage: Secondary | ICD-10-CM | POA: Diagnosis not present

## 2017-11-11 DIAGNOSIS — H47012 Ischemic optic neuropathy, left eye: Secondary | ICD-10-CM

## 2017-11-11 DIAGNOSIS — H353114 Nonexudative age-related macular degeneration, right eye, advanced atrophic with subfoveal involvement: Secondary | ICD-10-CM

## 2017-11-11 DIAGNOSIS — H43813 Vitreous degeneration, bilateral: Secondary | ICD-10-CM | POA: Diagnosis not present

## 2017-12-05 ENCOUNTER — Telehealth: Payer: Self-pay

## 2017-12-05 NOTE — Telephone Encounter (Signed)
error 

## 2017-12-12 ENCOUNTER — Telehealth: Payer: Self-pay | Admitting: Internal Medicine

## 2017-12-12 DIAGNOSIS — R069 Unspecified abnormalities of breathing: Secondary | ICD-10-CM

## 2017-12-12 DIAGNOSIS — R42 Dizziness and giddiness: Secondary | ICD-10-CM

## 2017-12-12 DIAGNOSIS — IMO0001 Reserved for inherently not codable concepts without codable children: Secondary | ICD-10-CM

## 2017-12-12 NOTE — Telephone Encounter (Signed)
Copied from [place] [phone]. Topic: General - Other >> Dec 05, 2017  3:23 PM Carolyn Stare wrote:   Pt request a call back he doctor from [facility] Dr Sanda Klein is asking for him to contact his pcp and ask them to schedule him a carotid artery and sleep study test  [phone]   Pt calling for an updated status. Please f/u with pt.

## 2017-12-13 NOTE — Telephone Encounter (Signed)
Ok for carotid duplex, and I have referred to pulmonary for possible sleep apnea

## 2017-12-17 NOTE — Telephone Encounter (Signed)
Pt has been called

## 2017-12-18 ENCOUNTER — Other Ambulatory Visit: Payer: Self-pay | Admitting: Internal Medicine

## 2017-12-18 DIAGNOSIS — R42 Dizziness and giddiness: Secondary | ICD-10-CM

## 2017-12-29 DIAGNOSIS — G5630 Lesion of radial nerve, unspecified upper limb: Secondary | ICD-10-CM | POA: Insufficient documentation

## 2017-12-29 DIAGNOSIS — G5620 Lesion of ulnar nerve, unspecified upper limb: Secondary | ICD-10-CM | POA: Insufficient documentation

## 2017-12-30 ENCOUNTER — Ambulatory Visit (HOSPITAL_COMMUNITY)
Admission: RE | Admit: 2017-12-30 | Discharge: 2017-12-30 | Disposition: A | Discharge status: Home / Self Care | Payer: BLUE CROSS/BLUE SHIELD | Source: Ambulatory Visit | Attending: Cardiovascular Disease | Admitting: Cardiovascular Disease

## 2017-12-30 ENCOUNTER — Other Ambulatory Visit: Payer: Self-pay | Admitting: Internal Medicine

## 2017-12-30 DIAGNOSIS — R42 Dizziness and giddiness: Secondary | ICD-10-CM | POA: Diagnosis not present

## 2017-12-30 DIAGNOSIS — H539 Unspecified visual disturbance: Secondary | ICD-10-CM | POA: Diagnosis present

## 2017-12-30 DIAGNOSIS — I6523 Occlusion and stenosis of bilateral carotid arteries: Secondary | ICD-10-CM | POA: Diagnosis not present

## 2018-01-12 ENCOUNTER — Ambulatory Visit: Payer: BLUE CROSS/BLUE SHIELD | Admitting: Neurology

## 2018-01-12 ENCOUNTER — Encounter: Payer: Self-pay | Admitting: Neurology

## 2018-01-12 VITALS — BP 144/87 | HR 77 | Ht 73.0 in | Wt 179.0 lb

## 2018-01-12 DIAGNOSIS — H3530 Unspecified macular degeneration: Secondary | ICD-10-CM | POA: Diagnosis not present

## 2018-01-12 DIAGNOSIS — R0683 Snoring: Secondary | ICD-10-CM

## 2018-01-12 DIAGNOSIS — R351 Nocturia: Secondary | ICD-10-CM | POA: Diagnosis not present

## 2018-01-12 DIAGNOSIS — H47019 Ischemic optic neuropathy, unspecified eye: Secondary | ICD-10-CM | POA: Diagnosis not present

## 2018-01-12 NOTE — Patient Instructions (Signed)
Thank you for choosing [facility] for your sleep related care! It was nice to meet you today! I appreciate that you entrust me with your sleep related healthcare concerns. I hope, I was able to address at least some of your concerns today, and that I can help you feel reassured and also get better.    Here is what we discussed today and what we came up with as our plan for you:     Based on your symptoms and your exam I believe you may be at some risk for obstructive sleep apnea or OSA, and I think we should proceed with a sleep study to determine whether you do or do not have OSA and how severe it is. If you have more than mild OSA, I want you to consider treatment with CPAP. Please remember, the risks and ramifications of moderate to severe obstructive sleep apnea or OSA are: Cardiovascular disease, including congestive heart failure, stroke, difficult to control hypertension, arrhythmias, and even type 2 diabetes has been linked to untreated OSA. Sleep apnea causes disruption of sleep and sleep deprivation in most cases, which, in turn, can cause recurrent headaches, problems with memory, mood, concentration, focus, and vigilance. Most people with untreated sleep apnea report excessive daytime sleepiness, which can affect their ability to drive. Please do not drive if you feel sleepy.   I will likely see you back after your sleep study to go over the test results and where to go from there. We will call you after your sleep study to advise about the results (most likely, you will hear from Catharine, my nurse) and to set up an appointment at the time, as necessary.    Our sleep lab administrative assistant will call you to schedule your sleep study. If you don't hear back from her by about 2 weeks from now, please feel free to call her at [phone]. You can leave a message with your phone number and concerns, if you get the voicemail box. She will call back as soon as possible.

## 2018-01-12 NOTE — Progress Notes (Signed)
Subjective:    Patient ID: Gerald Jenkins is a 67 y.o. male.  HPI     Star Age, MD, PhD [facility] [address] [place], [place] [address]  Dear Dr. Jenny Reichmann,   I saw your patient, Gerald Jenkins, upon your kind request in my neurologic clinic today for initial consultation of his sleep disorder, in particular, concern for underlying obstructive sleep apnea. The patient is unaccompanied today. As you know, Gerald Jenkins is a 67 year old right-handed gentleman with an underlying medical history of cataracts with status post cataract surgeries, smoking, hyperlipidemia, macular degeneration OD, NAION OS, osteoarthritis, melanoma, anxiety and neck pain, and L elbow pain, who reports snoring and nocturia once or twice per average night. He was encouraged by his ophthalmologist to pursue sleep study testing. He had a recent carotid Doppler ultrasound with benign results. I reviewed your office note from 10/02/2016. His Epworth sleepiness score is 2 out of 24 today, fatigue score is 13 out of 63. He lives with his girlfriend. He works as a Producer, television/film/video. He has 2 grown children, no grandchildren yet. He reports smoking occasionally, drinks alcohol about 10 drinks per week, typically 4-5 days a week, caffeine in the form of coffee, 2 cups per day on average, typically no sodas or tea on a daily basis. His bedtime is around 11 or 11:30 PM, wakeup time around 6:15 AM to 6:30 AM. He does not typically wake up with a headache. He wakes up reasonably well rested. He does have a TV in his bedroom but does not have an on at night, typically watches the news in the mornings. He does not have a family history of OSA. He denies telltale symptoms of restless leg syndrome or leg twitching at night.  His Past Medical History Is Significant For: Past Medical History:  Diagnosis Date  . ANXIETY 01/19/2008  . Cervicalgia 01/19/2008  . DISORDER OF BONE AND CARTILAGE UNSPECIFIED  01/11/2008  . HYPERLIPIDEMIA 01/19/2008  . Melanoma ([place]) 09/09/2015  . 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 PSA, INCREASED 07/12/2010  . RASH-NONVESICULAR 01/19/2008    His Past Surgical History Is Significant For: Past Surgical History:  Procedure Laterality Date  . CATARACT EXTRACTION    . lumbar disease      His Family History Is Significant For: Family History  Problem Relation Age of Onset  . Lumbar disc disease Father   . Multiple sclerosis Other   . Diabetes Other   . Cancer Other        lung cancer    His Social History Is Significant For: Social History   Socioeconomic History  . Marital status: Married    Spouse name: None  . Number of children: 2  . Years of education: None  . Highest education level: None  Social Needs  . Financial resource strain: None  . Food insecurity - worry: None  . Food insecurity - inability: None  . Transportation needs - medical: None  . Transportation needs - non-medical: None  Occupational History  . Occupation: Pharmacist, [employer]  Tobacco Use  . Smoking status: Current Some Day Smoker  . Smokeless tobacco: Never Used  Substance and Sexual Activity  . Alcohol use: Yes    Alcohol/week: 6.0 oz    Types: 10 Standard drinks or equivalent per week  . Drug use: None  . Sexual activity: None  Other Topics Concern  . None  Social History Narrative  . None    His  Allergies Are:  Allergies  Allergen Reactions  . Penicillins   :   His Current Medications Are:  Outpatient Encounter Medications as of 01/12/2018  Medication Sig  . Ascorbic Acid (VITAMIN C PO) Take by mouth.  Marland Kitchen aspirin 81 MG tablet Take 81 mg by mouth daily.    . rosuvastatin (CRESTOR) 40 MG tablet Take 0.5 tablets (20 mg total) by mouth daily. **PT NEEDS PHYSICAL FOR ADDITIONAL REFILLS**  . VIAGRA 100 MG tablet TAKE 1 TABLET BY MOUTH EVERY OTHER DAY AS NEEDED  . [DISCONTINUED] CRESTOR 40 MG tablet TAKE 1/2 TABLET BY MOUTH ONCE DAILY  . [DISCONTINUED]  indomethacin (INDOCIN) 50 MG capsule Take 1 capsule (50 mg total) by mouth 3 (three) times daily as needed.  . [DISCONTINUED] tadalafil (CIALIS) 20 MG tablet 1 by mouth every other day   No facility-administered encounter medications on file as of 01/12/2018.   :  Review of Systems:  Out of a complete 14 point review of systems, all are reviewed and negative with the exception of these symptoms as listed below: Review of Systems  Neurological:       Pt presents today to discuss his sleep. Pt has never had a sleep study but does endorse snoring.  Epworth Sleepiness Scale 0= would never doze 1= slight chance of dozing 2= moderate chance of dozing 3= high chance of dozing  Sitting and reading: 1 Watching TV: 1 Sitting inactive in a public place (ex. Theater or meeting): 0 As a passenger in a car for an hour without a break: 0 Lying down to rest in the afternoon: 0 Sitting and talking to someone: 0 Sitting quietly after lunch (no alcohol): 0 In a car, while stopped in traffic: 0 Total: 2     Objective:  Neurological Exam  Physical Exam Physical Examination:   Vitals:   01/12/18 1536  BP: (!) 144/87  Pulse: 77   General Examination: The patient is a very pleasant 67 y.o. male in no acute distress. He appears well-developed and well-nourished and well groomed.   HEENT: Normocephalic, atraumatic, pupils are equal, round and reactive to light and accommodation. He is s/p cataract repairs. Extraocular tracking is fairly good. He is visually impaired b/l. Normal smooth pursuit is noted. Hearing is grossly intact. Face is symmetric with normal facial animation and normal facial sensation. Speech is clear with no dysarthria noted. There is no hypophonia. There is no lip, neck/head, jaw or voice tremor. Neck is supple with full range of passive and active motion. There are no carotid bruits on auscultation. Oropharynx exam reveals: mild mouth dryness, adequate dental hygiene and moderate  airway crowding, due to redundant soft palate, smaller airway entry and larger uvula. Tonsils are not fully visualized but appear to be rather small. Mallampati is class III. Neck circumference is 17-1/4 inches. He has a mild overbite. Tongue protrudes centrally and palate elevates symmetrically.  Chest: Clear to auscultation without wheezing, rhonchi or crackles noted.  Heart: S1+S2+0, regular and normal without murmurs, rubs or gallops noted.   Abdomen: Soft, non-tender and non-distended with normal bowel sounds appreciated on auscultation.  Extremities: There is no pitting edema in the distal lower extremities bilaterally. Pedal pulses are intact.  Skin: Warm and dry without trophic changes noted.  Musculoskeletal: exam reveals no obvious joint deformities, tenderness or joint swelling or erythema.   Neurologically:  Mental status: The patient is awake, alert and oriented in all 4 spheres. His immediate and remote memory, attention, language skills and fund  of knowledge are appropriate. There is no evidence of aphasia, agnosia, apraxia or anomia. Speech is clear with normal prosody and enunciation. Thought process is linear. Mood is normal and affect is normal.  Cranial nerves II - XII are as described above under HEENT exam. In addition: shoulder shrug is normal with equal shoulder height noted. Motor exam: Normal bulk, strength and tone is noted. There is no drift, tremor or rebound. Romberg is negative. Reflexes are 1+ throughout. Fine motor skills and coordination: intact with normal finger taps, normal hand movements, normal rapid alternating patting, normal foot taps and normal foot agility.  Cerebellar testing: No dysmetria or intention tremor. There is no truncal or gait ataxia.  Sensory exam: intact to light touch in the upper and lower extremities.  Gait, station and balance: He stands easily. No veering to one side is noted. No leaning to one side is noted. Posture is  age-appropriate and stance is narrow based. Gait shows normal stride length and normal pace. No problems turning are noted.              Assessment and Plan:  In summary, DEMETRES PROCHNOW is a very pleasant 67 y.o.-year old male with an underlying medical history of cataracts with status post cataract surgeries, smoking, hyperlipidemia, macular degeneration OD, NAION OS, osteoarthritis, melanoma, anxiety and neck pain, and L elbow pain, whose history and physical exam are concerning for obstructive sleep apnea (OSA). I had a long chat with the patient about my findings and the diagnosis of OSA, its prognosis and treatment options. We talked about medical treatments, surgical interventions and non-pharmacological approaches. I explained in particular the risks and ramifications of untreated moderate to severe OSA, especially with respect to developing cardiovascular disease down the Road, including congestive heart failure, difficult to treat hypertension, cardiac arrhythmias, or stroke. Even type 2 diabetes has, in part, been linked to untreated OSA. Symptoms of untreated OSA include daytime sleepiness, memory problems, mood irritability and mood disorder such as depression and anxiety, lack of energy, as well as recurrent headaches, especially morning headaches. We talked about smoking cessation and trying to maintain a healthy lifestyle in general, as well as the importance of weight control. I encouraged the patient to eat healthy, exercise daily and keep well hydrated, to keep a scheduled bedtime and wake time routine, to not skip any meals and eat healthy snacks in between meals. I advised the patient not to drive when feeling sleepy. I am not sure if he should be driving at this time. He reports that he drives locally, 35 mile sounds only, as he is visually impaired bilaterally, I am not sure if he should be driving at all. I recommended the following at this time: sleep study with potential positive airway  pressure titration. (We will score hypopneas at 4%).   I explained the sleep test procedure to the patient and also outlined possible surgical and non-surgical treatment options of OSA, including the use of a custom-made dental device (which would require a referral to a specialist dentist or oral surgeon), upper airway surgical options, such as pillar implants, radiofrequency surgery, tongue base surgery, and UPPP (which would involve a referral to an ENT surgeon). Rarely, jaw surgery such as mandibular advancement may be considered.  I also explained the CPAP treatment option to the patient, who indicated that he would be willing to try CPAP if the need arises. I explained the importance of being compliant with PAP treatment, not only for insurance purposes but  primarily to improve His symptoms, and for the patient's long term health benefit, including to reduce His cardiovascular risks. I answered all his questions today and the patient was in agreement. I would like to see him back after the sleep study is completed and encouraged him to call with any interim questions, concerns, problems or updates.   Thank you very much for allowing me to participate in the care of this nice patient. If I can be of any further assistance to you please do not hesitate to call me at [phone].  Sincerely,   Star Age, MD, PhD

## 2018-02-17 ENCOUNTER — Telehealth: Payer: Self-pay | Admitting: Neurology

## 2018-02-17 NOTE — Telephone Encounter (Signed)
Pt cancelled his sleep study. He stated he wants to have his ov with Dr. Rexene Alberts first. Pt is scheduled for ov on 02/24/18. I informed the pt he could call back after his appt if he wanted to r/s.

## 2018-02-24 ENCOUNTER — Ambulatory Visit: Payer: BLUE CROSS/BLUE SHIELD | Admitting: Neurology

## 2018-02-24 ENCOUNTER — Encounter: Payer: Self-pay | Admitting: Neurology

## 2018-02-24 ENCOUNTER — Telehealth: Payer: Self-pay

## 2018-02-24 VITALS — BP 150/87 | HR 98 | Ht 73.0 in | Wt 181.0 lb

## 2018-02-24 DIAGNOSIS — M25522 Pain in left elbow: Secondary | ICD-10-CM | POA: Diagnosis not present

## 2018-02-24 DIAGNOSIS — R29898 Other symptoms and signs involving the musculoskeletal system: Secondary | ICD-10-CM

## 2018-02-24 NOTE — Telephone Encounter (Signed)
I called pt, per Dr. Rexene Alberts request, to discuss his appt today. Dr. Rexene Alberts does not have anything to offer him regarding his radial tunnel syndrome.  No answer, left a message asking him to call me back.

## 2018-02-24 NOTE — Patient Instructions (Signed)
I would recommend another surgical opinion for your degenerative neck disease. You left middle finger weakness may be related to your neck. I am not sure what else to suggest for you, I am afraid. I hope you feel better soon and get the answers you deserve.  Please call when you are ready to schedule your sleep study.

## 2018-02-24 NOTE — Progress Notes (Signed)
;Subjective:    Patient ID: Gerald Jenkins is a 67 y.o. male.  HPI     Interim history:   Dear Dr. Caralyn Guile,     I saw your patient, Gerald Jenkins, upon your kind request in my neurologic clinic today for initial consultation of his left elbow pain, concern for radial tunnel syndrome. The patient is unaccompanied today. As you know, Gerald Jenkins is a 67 year old right-handed gentleman with an underlying medical history of NAION of the left eye, macular degeneration affecting the right eye, status post cataract surgeries, smoking, hyperlipidemia, history of melanoma, osteoarthritis, anxiety, neck pain and left elbow pain, who reports a several month Hx of L middle finger weakness and forearm weakness. I reviewed your office records from 10/31/2017, which you kindly included. He had an MRI of the brachial plexus bilaterally on 01/06/2018 and I reviewed the results: Impression: No mass or fluid collection along the course of the brachial plexus and no defibrillation of the plexus. Multilevel cervical spondylosis. He had an MRI of the left forearm on 10/27/2017 and I reviewed the results: Impression: No discrete mass or mass effect is identified along the visualized course of the posterior interosseus nerve. Areas of denervation muscular edema noted within the extensor digitorum, EDS, ECU, but also within the anconeus, SCL and flexor digitorum muscles. Given the multiple nerve distribution pattern consider evaluation of the brachial plexus. Small area of susceptibility artifact along the posterolateral proximal elbow which may relate to prior laceration, surgical change or a tiny superficial metallic body. Correlation with radiography may be helpful. He had EMG and nerve conduction testing of her left arm. He had a study on 10/06/2017 which showed an abnormal study. 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 No signs of ongoing  denervation. Decreased recruitment noted in these muscles. 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 His pain has significantly subsided but now he is left with weakness.  He had surgery on 08/06/2017 on his left elbow in the form of neuroplasty transposition of the ulnar nerve at the elbow on the left. He had tendon lengthening upper arm.  He had prior EMG and nerve conduction testing of the left arm on 06/30/2017.  He had a cervical spine MRI on 06/23/2017 and I reviewed the results: Impression: Mild progression of the degenerative changes of the cervical spine since the previous study of 11/07/2008. At the 4-5 slightly larger disc bulge which minimally deforms the ventral surface of the cord. At 36/7 slightly larger disc bulge which contacts but does not deform the ventral surface of the cord. Moderate severe bilateral foraminal narrowing with probable encroachment of the C7 dorsal root ganglia. At the C7-T1 advanced bilateral facet arthrosis, worse since the previous study with associated marrow edema. Moderate to severe left neuroforaminal narrowing with probable encroachment of the left C8 dorsal root ganglion. Straightening of the cervical lordosis suggestive of muscle spasm.  He reports right shoulder pain some 9 or 10 years ago which eventually resolved after physical therapy. Last year in the spring time he developed similar left shoulder problems for which he then had physical therapy and eventually saw Dr. Gladstone Lighter, , then Dr. Rolena Infante. He was told that he did not have a surgical issue in the neck. He reports difficulty with finger extension of the middle finger and the left hand. He has difficulty gripping things. He is left-handed and is  certainly affected by his fine motor dyscontrol of the left hand. He does not have any sinister paresthesias or pain.   The patient's allergies, current medications, family  history, past medical history, past social history, past surgical history and problem list were reviewed and updated as appropriate.   Previously:   01/12/2018: 67 year old right-handed gentleman with an underlying medical history of cataracts with status post cataract surgeries, smoking, hyperlipidemia, macular degeneration OD, NAION OS, osteoarthritis, melanoma, anxiety and neck pain, and L elbow pain, who reports snoring and nocturia once or twice per average night. He was encouraged by his ophthalmologist to pursue sleep study testing. He had a recent carotid Doppler ultrasound with benign results. I reviewed your office note from 10/02/2016. His Epworth sleepiness score is 2 out of 24 today, fatigue score is 13 out of 63. He lives with his girlfriend. He works as a Producer, television/film/video. He has 2 grown children, no grandchildren yet. He reports smoking occasionally, drinks alcohol about 10 drinks per week, typically 4-5 days a week, caffeine in the form of coffee, 2 cups per day on average, typically no sodas or tea on a daily basis. His bedtime is around 11 or 11:30 PM, wakeup time around 6:15 AM to 6:30 AM. He does not typically wake up with a headache. He wakes up reasonably well rested. He does have a TV in his bedroom but does not have an on at night, typically watches the news in the mornings. He does not have a family history of OSA. He denies telltale symptoms of restless leg syndrome or leg twitching at night.  His Past Medical History Is Significant For: Past Medical History:  Diagnosis Date  . ANXIETY 01/19/2008  . Cervicalgia 01/19/2008  . DISORDER OF BONE AND CARTILAGE UNSPECIFIED 01/11/2008  . HYPERLIPIDEMIA 01/19/2008  . Melanoma ([place]) 09/09/2015  . 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 PSA, INCREASED 07/12/2010  . RASH-NONVESICULAR 01/19/2008    His Past Surgical History Is Significant For: Past Surgical History:  Procedure Laterality Date  . CATARACT EXTRACTION    . lumbar disease      His  Family History Is Significant For: Family History  Problem Relation Age of Onset  . Lumbar disc disease Father   . Multiple sclerosis Other   . Diabetes Other   . Cancer Other        lung cancer    His Social History Is Significant For: Social History   Socioeconomic History  . Marital status: Married    Spouse name: None  . Number of children: 2  . Years of education: None  . Highest education level: None  Social Needs  . Financial resource strain: None  . Food insecurity - worry: None  . Food insecurity - inability: None  . Transportation needs - medical: None  . Transportation needs - non-medical: None  Occupational History  . Occupation: Pharmacist, [employer]  Tobacco Use  . Smoking status: Current Some Day Smoker  . Smokeless tobacco: Never Used  Substance and Sexual Activity  . Alcohol use: Yes    Alcohol/week: 6.0 oz    Types: 10 Standard drinks or equivalent per week  . Drug use: None  . Sexual activity: None  Other Topics Concern  . None  Social History Narrative  . None    His Allergies Are:  Allergies  Allergen Reactions  . Penicillins   :   His Current Medications Are:  Outpatient Encounter Medications as of 02/24/2018  Medication Sig  . Ascorbic  Acid (VITAMIN C PO) Take by mouth.  Marland Kitchen aspirin 81 MG tablet Take 81 mg by mouth daily.    . rosuvastatin (CRESTOR) 40 MG tablet Take 0.5 tablets (20 mg total) by mouth daily. **PT NEEDS PHYSICAL FOR ADDITIONAL REFILLS**  . VIAGRA 100 MG tablet TAKE 1 TABLET BY MOUTH EVERY OTHER DAY AS NEEDED   No facility-administered encounter medications on file as of 02/24/2018.   :  Review of Systems:  Out of a complete 14 point review of systems, all are reviewed and negative with the exception of these symptoms as listed below:  Review of Systems  Neurological:       Pt presents today to discuss the weakness in his arm. Pt has not scheduled his sleep study.    Objective:  Neurological Exam  Physical  Exam Physical Examination:   Vitals:   02/24/18 1429  BP: (!) 150/87  Pulse: 98    General Examination: The patient is a very pleasant 67 y.o. male in no acute distress. He appears well-developed and well-nourished and well groomed.   HEENT: Normocephalic, atraumatic, pupils are equal, round and reactive to light and accommodation. He is s/p cataract repairs. Extraocular tracking is fairly good. He is visually impaired b/l. Hearing is grossly intact. Face is symmetric with normal facial animation and normal facial sensation. Speech is clear with no dysarthria noted. There is no hypophonia. There is no lip, neck/head, jaw or voice tremor. Neck is supple with full range of passive and active motion. Oropharynx exam reveals: no obvious changes.   Chest: Clear to auscultation without wheezing, rhonchi or crackles noted.  Heart: S1+S2+0, regular and normal without murmurs, rubs or gallops noted.   Abdomen: Soft, non-tender and non-distended with normal bowel sounds appreciated on auscultation.  Extremities: There is no pitting edema in the distal lower extremities bilaterally. Pedal pulses are intact.  Skin: Warm and dry without trophic changes noted.  Musculoskeletal: exam reveals no obvious joint deformities, tenderness or joint swelling or erythema.   Neurologically:  Mental status: The patient is awake, alert and oriented in all 4 spheres. His immediate and remote memory, attention, language skills and fund of knowledge are appropriate. There is no evidence of aphasia, agnosia, apraxia or anomia. Speech is clear with normal prosody and enunciation. Thought process is linear. Mood is normal and affect is normal.  Cranial nerves II - XII are as described above under HEENT exam. In addition: shoulder shrug is normal with equal shoulder height noted. Motor exam: Normal bulk, strength and tone is noted, with the exception of left middle finger extensor weakness, no telltale atrophy, no  fasciculations are noted in the hand, no thenar or hypothenar atrophy. Reflexes are about 1+ throughout including ankles. There is no drift, or tremor or rebound. Romberg is negative with the exception of initial sway. Fine motor skills and coordination: intact with normal finger taps, normal hand movements, normal rapid alternating patting, normal foot taps and normal foot agility, with the exception of fine motor control with the left middle finger.  Cerebellar testing: No dysmetria or intention tremor. There is no truncal or gait ataxia.  Sensory exam: intact to light touch in the upper and lower extremities.  Gait, station and balance: He stands easily. No veering to one side is noted. No leaning to one side is noted. Posture is age-appropriate and stance is narrow based. Gait shows normal stride length and normal pace. No problems turning are noted.  Assessment and Plan:  In summary, Gerald Jenkins is a very pleasant 67 year old male with an underlying medical history of cataracts with status post cataract surgeries, smoking, hyperlipidemia, macular degeneration OD, NAION OS, osteoarthritis, melanoma, anxiety and neck pain, and L elbow pain, who presents for neurologic consultation of his history of left elbow pain and weakness as a remnant after left elbow surgery. His history and exam are not in keeping with a widespread neuropathy or neuromuscular illness. He has degenerative neck disease. He may have weakness as a remnant from prior surgery versus degenerative cervical spine disease. He certainly does not have evidence of myelopathy on examination and also on his cervical spine MRI. He is encouraged to seek consultation with a spine surgeon, is a second opinion. He is encouraged to talk to you or his primary care physician about this. From my end of things, he has had thorough workup in the form of multiple MRIs and also at least 2 EMG and nerve conduction test. Unfortunately, there is  not much else I can offer him. He is encouraged to talk to you about what form of exercises he can do. He has had physical therapy but is reluctant to pursue this again. He may benefit from exercising his fine motor skills and also some strengthening exercises, light weight exercises but he is encouraged to get guidance from you on that regard. I will see him back on an as-needed basis. He is reminded to schedule his sleep study at his next convenience. I answered all his questions today and he was in agreement.   Thank you very much for allowing me to participate in the care of this nice patient. If I can be of any further assistance to you please do not hesitate to call me at [phone].  Sincerely,   Star Age, MD, PhD

## 2018-02-24 NOTE — Telephone Encounter (Signed)
Pt returned my call. I explained that Dr. Rexene Alberts does not have any further recommendations for his arm weakness based on her review of is diagnostic testing and records. Pt says that he would still like to come in and discuss it with her.

## 2018-09-24 ENCOUNTER — Telehealth: Payer: Self-pay

## 2018-09-24 DIAGNOSIS — Z0000 Encounter for general adult medical examination without abnormal findings: Secondary | ICD-10-CM

## 2018-09-24 NOTE — Telephone Encounter (Signed)
Labs have been entered. Patient can come whenever is convenient for him.    Copied from [place] [phone]. Topic: General - Other >> Sep 24, 2018  3:34 PM Bea Graff, NT wrote: Reason for CRM: Pt would like to see if his CPE labs orders can be put in so that he may come to have those drawn before his physical on 11/06/18? Please advise.

## 2018-11-06 ENCOUNTER — Other Ambulatory Visit (INDEPENDENT_AMBULATORY_CARE_PROVIDER_SITE_OTHER): Payer: BLUE CROSS/BLUE SHIELD

## 2018-11-06 ENCOUNTER — Ambulatory Visit (INDEPENDENT_AMBULATORY_CARE_PROVIDER_SITE_OTHER): Payer: BLUE CROSS/BLUE SHIELD | Admitting: Internal Medicine

## 2018-11-06 ENCOUNTER — Encounter: Payer: Self-pay | Admitting: Internal Medicine

## 2018-11-06 VITALS — BP 136/86 | HR 77 | Temp 98.0°F | Ht 73.0 in | Wt 179.0 lb

## 2018-11-06 DIAGNOSIS — Z1159 Encounter for screening for other viral diseases: Secondary | ICD-10-CM | POA: Diagnosis not present

## 2018-11-06 DIAGNOSIS — Z23 Encounter for immunization: Secondary | ICD-10-CM

## 2018-11-06 DIAGNOSIS — Z0000 Encounter for general adult medical examination without abnormal findings: Secondary | ICD-10-CM | POA: Diagnosis not present

## 2018-11-06 LAB — URINALYSIS, ROUTINE W REFLEX MICROSCOPIC
Bilirubin Urine: NEGATIVE
Hgb urine dipstick: NEGATIVE
KETONES UR: NEGATIVE
LEUKOCYTES UA: NEGATIVE
Nitrite: NEGATIVE
RBC / HPF: NONE SEEN (ref 0–?)
Specific Gravity, Urine: 1.01 (ref 1.000–1.030)
TOTAL PROTEIN, URINE-UPE24: NEGATIVE
URINE GLUCOSE: NEGATIVE
Urobilinogen, UA: 0.2 (ref 0.0–1.0)
WBC, UA: NONE SEEN (ref 0–?)
pH: 6 (ref 5.0–8.0)

## 2018-11-06 LAB — BASIC METABOLIC PANEL
BUN: 16 mg/dL (ref 6–23)
CALCIUM: 9.4 mg/dL (ref 8.4–10.5)
CO2: 30 mEq/L (ref 19–32)
CREATININE: 1.1 mg/dL (ref 0.40–1.50)
Chloride: 105 mEq/L (ref 96–112)
GFR: 71.08 mL/min (ref >60.00)
GLUCOSE: 90 mg/dL (ref 70–99)
Potassium: 4.5 mEq/L (ref 3.5–5.1)
Sodium: 143 mEq/L (ref 135–145)

## 2018-11-06 LAB — HEPATIC FUNCTION PANEL
ALT: 14 U/L (ref 0–53)
AST: 14 U/L (ref 0–37)
Albumin: 4.4 g/dL (ref 3.5–5.2)
Alkaline Phosphatase: 45 U/L (ref 39–117)
BILIRUBIN DIRECT: 0 mg/dL (ref 0.0–0.3)
BILIRUBIN TOTAL: 0.3 mg/dL (ref 0.2–1.2)
Total Protein: 6.8 g/dL (ref 6.0–8.3)

## 2018-11-06 LAB — LIPID PANEL
CHOLESTEROL: 264 mg/dL — AB (ref 0–200)
HDL: 57.8 mg/dL (ref >39.00)
LDL CALC: 186 mg/dL — AB (ref 0–99)
NONHDL: 206.4
Total CHOL/HDL Ratio: 5
Triglycerides: 102 mg/dL (ref 0.0–149.0)
VLDL: 20.4 mg/dL (ref 0.0–40.0)

## 2018-11-06 LAB — CBC WITH DIFFERENTIAL/PLATELET
BASOS PCT: 1.1 % (ref 0.0–3.0)
Basophils Absolute: 0.1 10*3/uL (ref 0.0–0.1)
EOS ABS: 0.2 10*3/uL (ref 0.0–0.7)
EOS PCT: 2.9 % (ref 0.0–5.0)
HEMATOCRIT: 42.7 % (ref 39.0–52.0)
HEMOGLOBIN: 14.7 g/dL (ref 13.0–17.0)
LYMPHS PCT: 30.9 % (ref 12.0–46.0)
Lymphs Abs: 2.6 10*3/uL (ref 0.7–4.0)
MCHC: 34.5 g/dL (ref 30.0–36.0)
MCV: 90.5 fl (ref 78.0–100.0)
MONOS PCT: 8.5 % (ref 3.0–12.0)
Monocytes Absolute: 0.7 10*3/uL (ref 0.1–1.0)
NEUTROS ABS: 4.8 10*3/uL (ref 1.4–7.7)
Neutrophils Relative %: 56.6 % (ref 43.0–77.0)
PLATELETS: 320 10*3/uL (ref 150.0–400.0)
RBC: 4.72 Mil/uL (ref 4.22–5.81)
RDW: 13 % (ref 11.5–15.5)
WBC: 8.4 10*3/uL (ref 4.0–10.5)

## 2018-11-06 LAB — TSH: TSH: 5.13 u[IU]/mL — AB (ref 0.35–4.50)

## 2018-11-06 LAB — PSA: PSA: 0.81 ng/mL (ref 0.10–4.00)

## 2018-11-06 MED ORDER — SILDENAFIL CITRATE 100 MG PO TABS: ORAL_TABLET | ORAL | 11 refills | Status: DC

## 2018-11-06 MED ORDER — ROSUVASTATIN CALCIUM 40 MG PO TABS: 20.0000 mg | ORAL_TABLET | Freq: Every day | ORAL | 1 refills | Status: DC

## 2018-11-06 MED ORDER — ZOSTER VAC RECOMB ADJUVANTED 50 MCG/0.5ML IM SUSR: 0.5000 mL | Freq: Once | INTRAMUSCULAR | 1 refills | Status: AC

## 2018-11-06 NOTE — Progress Notes (Signed)
Subjective:    Patient ID: Gerald Jenkins, male    DOB: 10/08/1951, 67 y.o.   MRN: [id number]  HPI    Here for wellness and f/u;  Overall doing ok;  Pt denies Chest pain, worsening SOB, DOE, wheezing, orthopnea, PND, worsening LE edema, palpitations, dizziness or syncope.  Pt denies neurological change such as new headache, facial or extremity weakness.  Pt denies polydipsia, polyuria, or low sugar symptoms. Pt states overall good compliance with treatment and medications, good tolerability, and has been trying to follow appropriate diet.  Pt denies worsening depressive symptoms, suicidal ideation or panic. No fever, night sweats, wt loss, loss of appetite, or other constitutional symptoms.  Pt states good ability with ADL's, has low fall risk, home safety reviewed and adequate, no other significant changes in hearing or vision, and only occasionally active with exercise.  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  Due for colonoscopy Past Medical History:  Diagnosis Date  . ANXIETY 01/19/2008  . Cervicalgia 01/19/2008  . DISORDER OF BONE AND CARTILAGE UNSPECIFIED 01/11/2008  . HYPERLIPIDEMIA 01/19/2008  . Melanoma ([place]) 09/09/2015  . 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 PSA, INCREASED 07/12/2010  . RASH-NONVESICULAR 01/19/2008   Past Surgical History:  Procedure Laterality Date  . CATARACT EXTRACTION    . lumbar disease      reports that he has been smoking. He has never used smokeless tobacco. He reports that he drinks about 10.0 standard drinks of alcohol per week. His drug history is not on file. family history includes Cancer in his other; Diabetes in his other; Lumbar disc disease in his father; Multiple sclerosis in his other. Allergies  Allergen Reactions  . Penicillins    Current Outpatient Medications on File Prior to Visit  Medication Sig Dispense Refill  . Ascorbic Acid (VITAMIN C PO) Take by mouth.    Marland Kitchen aspirin 81 MG tablet Take 81 mg by mouth daily.        No current facility-administered medications on file prior to visit.    Review of Systems Constitutional: Negative for other unusual diaphoresis, sweats, appetite or weight changes HENT: Negative for other worsening hearing loss, ear pain, facial swelling, mouth sores or neck stiffness.   Eyes: Negative for other worsening pain, redness or other visual disturbance.  Respiratory: Negative for other stridor or swelling Cardiovascular: Negative for other palpitations or other chest pain  Gastrointestinal: Negative for worsening diarrhea or loose stools, blood in stool, distention or other pain Genitourinary: Negative for hematuria, flank pain or other change in urine volume.  Musculoskeletal: Negative for myalgias or other joint swelling.  Skin: Negative for other color change, or other wound or worsening drainage.  Neurological: Negative for other syncope or numbness. Hematological: Negative for other adenopathy or swelling Psychiatric/Behavioral: Negative for hallucinations, other worsening agitation, SI, self-injury, or new decreased concentration All other system neg per pt    Objective:   Physical Exam BP 136/86   Pulse 77   Temp 98 F (36.7 C) (Oral)   Ht 6\' 1"  (1.854 m)   Wt 179 lb (81.2 kg)   SpO2 95%   BMI 23.62 kg/m  VS noted,  Constitutional: Pt is oriented to person, place, and time. Appears well-developed and well-nourished, in no significant distress and comfortable Head: Normocephalic and atraumatic  Eyes: Conjunctivae and EOM are normal. Pupils are equal, round, and reactive to light Right Ear: External ear normal without discharge Left Ear: External ear normal  without discharge Nose: Nose without discharge or deformity Mouth/Throat: Oropharynx is without other ulcerations and moist  Neck: Normal range of motion. Neck supple. No JVD present. No tracheal deviation present or significant neck LA or mass Cardiovascular: Normal rate, regular rhythm, normal heart  sounds and intact distal pulses.   Pulmonary/Chest: WOB normal and breath sounds without rales or wheezing  Abdominal: Soft. Bowel sounds are normal. NT. No HSM  Musculoskeletal: Normal range of motion. Exhibits no edema Lymphadenopathy: Has no other cervical adenopathy.  Neurological: Pt is alert and oriented to person, place, and time. Pt has normal reflexes. No cranial nerve deficit. Motor grossly intact, Gait intact Skin: Skin is warm and dry. No rash noted or new ulcerations Psychiatric:  Has normal mood and affect. Behavior is normal without agitation No other exam findings Lab Results  Component Value Date   WBC 8.4 11/06/2018   HGB 14.7 11/06/2018   HCT 42.7 11/06/2018   PLT 320.0 11/06/2018   GLUCOSE 90 11/06/2018   CHOL 264 (H) 11/06/2018   TRIG 102.0 11/06/2018   HDL 57.80 11/06/2018   LDLDIRECT 66.4 07/09/2010   LDLCALC 186 (H) 11/06/2018   ALT 14 11/06/2018   AST 14 11/06/2018   NA 143 11/06/2018   K 4.5 11/06/2018   CL 105 11/06/2018   CREATININE 1.10 11/06/2018   BUN 16 11/06/2018   CO2 30 11/06/2018   TSH 5.13 (H) 11/06/2018   PSA 0.81 11/06/2018      Assessment & Plan:

## 2018-11-06 NOTE — Patient Instructions (Addendum)
Please return in 2 weeks for a  Nurse visit appt for the Prevnar 13 pneumonia shot  We can do the Pneumovax next year.   You had the flu shot today  You will be contacted regarding the referral for: colonoscopy  OK to restart the crestor  Please continue all other medications as before, and refills have been done if requested.  Please have the pharmacy call with any other refills you may need.  Please continue your efforts at being more active, low cholesterol diet, and weight control.  You are otherwise up to date with prevention measures today.  Please keep your appointments with your specialists as you may have planned  Please return in 1 year for your yearly visit, or sooner if needed, with Lab testing done 3-5 days before

## 2018-11-07 NOTE — Assessment & Plan Note (Signed)

## 2019-01-11 ENCOUNTER — Encounter: Payer: Self-pay | Admitting: Internal Medicine

## 2019-11-04 ENCOUNTER — Other Ambulatory Visit (INDEPENDENT_AMBULATORY_CARE_PROVIDER_SITE_OTHER): Payer: BC Managed Care – PPO

## 2019-11-04 DIAGNOSIS — Z125 Encounter for screening for malignant neoplasm of prostate: Secondary | ICD-10-CM

## 2019-11-04 DIAGNOSIS — Z1159 Encounter for screening for other viral diseases: Secondary | ICD-10-CM

## 2019-11-04 DIAGNOSIS — Z0000 Encounter for general adult medical examination without abnormal findings: Secondary | ICD-10-CM

## 2019-11-04 LAB — CBC WITH DIFFERENTIAL/PLATELET
Basophils Absolute: 0.1 10*3/uL (ref 0.0–0.1)
Basophils Relative: 0.7 % (ref 0.0–3.0)
Eosinophils Absolute: 0.1 10*3/uL (ref 0.0–0.7)
Eosinophils Relative: 1.2 % (ref 0.0–5.0)
HCT: 41.1 % (ref 39.0–52.0)
Hemoglobin: 13.9 g/dL (ref 13.0–17.0)
Lymphocytes Relative: 20.2 % (ref 12.0–46.0)
Lymphs Abs: 2.4 10*3/uL (ref 0.7–4.0)
MCHC: 33.9 g/dL (ref 30.0–36.0)
MCV: 89.7 fl (ref 78.0–100.0)
Monocytes Absolute: 1.2 10*3/uL — ABNORMAL HIGH (ref 0.1–1.0)
Monocytes Relative: 9.9 % (ref 3.0–12.0)
Neutro Abs: 8.2 10*3/uL — ABNORMAL HIGH (ref 1.4–7.7)
Neutrophils Relative %: 68 % (ref 43.0–77.0)
Platelets: 375 10*3/uL (ref 150.0–400.0)
RBC: 4.58 Mil/uL (ref 4.22–5.81)
RDW: 12.7 % (ref 11.5–15.5)
WBC: 12.1 10*3/uL — ABNORMAL HIGH (ref 4.0–10.5)

## 2019-11-04 LAB — BASIC METABOLIC PANEL
BUN: 10 mg/dL (ref 6–23)
CO2: 29 mEq/L (ref 19–32)
Calcium: 9.7 mg/dL (ref 8.4–10.5)
Chloride: 98 mEq/L (ref 96–112)
Creatinine, Ser: 0.92 mg/dL (ref 0.40–1.50)
GFR: 81.95 mL/min (ref >60.00)
Glucose, Bld: 90 mg/dL (ref 70–99)
Potassium: 4.8 mEq/L (ref 3.5–5.1)
Sodium: 136 mEq/L (ref 135–145)

## 2019-11-04 LAB — HEPATIC FUNCTION PANEL
ALT: 13 U/L (ref 0–53)
AST: 15 U/L (ref 0–37)
Albumin: 4.5 g/dL (ref 3.5–5.2)
Alkaline Phosphatase: 57 U/L (ref 39–117)
Bilirubin, Direct: 0.1 mg/dL (ref 0.0–0.3)
Total Bilirubin: 0.5 mg/dL (ref 0.2–1.2)
Total Protein: 7.3 g/dL (ref 6.0–8.3)

## 2019-11-04 LAB — URINALYSIS, ROUTINE W REFLEX MICROSCOPIC
Bilirubin Urine: NEGATIVE
Hgb urine dipstick: NEGATIVE
Ketones, ur: NEGATIVE
Leukocytes,Ua: NEGATIVE
Nitrite: NEGATIVE
RBC / HPF: NONE SEEN (ref 0–?)
Specific Gravity, Urine: 1.01 (ref 1.000–1.030)
Total Protein, Urine: NEGATIVE
Urine Glucose: NEGATIVE
Urobilinogen, UA: 0.2 (ref 0.0–1.0)
WBC, UA: NONE SEEN (ref 0–?)
pH: 6.5 (ref 5.0–8.0)

## 2019-11-04 LAB — PSA: PSA: 2.1 ng/mL (ref 0.10–4.00)

## 2019-11-04 LAB — TSH: TSH: 3.27 u[IU]/mL (ref 0.35–4.50)

## 2019-11-04 LAB — LIPID PANEL
Cholesterol: 187 mg/dL (ref 0–200)
HDL: 65.2 mg/dL (ref >39.00)
LDL Cholesterol: 105 mg/dL — ABNORMAL HIGH (ref 0–99)
NonHDL: 121.3
Total CHOL/HDL Ratio: 3
Triglycerides: 80 mg/dL (ref 0.0–149.0)
VLDL: 16 mg/dL (ref 0.0–40.0)

## 2019-11-08 ENCOUNTER — Ambulatory Visit (INDEPENDENT_AMBULATORY_CARE_PROVIDER_SITE_OTHER): Payer: BC Managed Care – PPO | Admitting: Internal Medicine

## 2019-11-08 ENCOUNTER — Encounter: Payer: Self-pay | Admitting: Internal Medicine

## 2019-11-08 ENCOUNTER — Other Ambulatory Visit: Payer: Self-pay

## 2019-11-08 VITALS — BP 142/90 | HR 92 | Temp 98.9°F | Ht 73.0 in | Wt 181.0 lb

## 2019-11-08 DIAGNOSIS — M5416 Radiculopathy, lumbar region: Secondary | ICD-10-CM | POA: Diagnosis not present

## 2019-11-08 DIAGNOSIS — Z0001 Encounter for general adult medical examination with abnormal findings: Secondary | ICD-10-CM

## 2019-11-08 DIAGNOSIS — Z0000 Encounter for general adult medical examination without abnormal findings: Secondary | ICD-10-CM

## 2019-11-08 DIAGNOSIS — Z23 Encounter for immunization: Secondary | ICD-10-CM

## 2019-11-08 DIAGNOSIS — R972 Elevated prostate specific antigen [PSA]: Secondary | ICD-10-CM

## 2019-11-08 DIAGNOSIS — E785 Hyperlipidemia, unspecified: Secondary | ICD-10-CM | POA: Diagnosis not present

## 2019-11-08 LAB — HEPATITIS C ANTIBODY
Hepatitis C Ab: NONREACTIVE
SIGNAL TO CUT-OFF: 0.01 (ref <1.00)

## 2019-11-08 MED ORDER — SILDENAFIL CITRATE 100 MG PO TABS: ORAL_TABLET | ORAL | 11 refills | Status: DC

## 2019-11-08 MED ORDER — ROSUVASTATIN CALCIUM 40 MG PO TABS: 20.0000 mg | ORAL_TABLET | Freq: Every day | ORAL | 11 refills | Status: DC

## 2019-11-08 MED ORDER — GABAPENTIN 100 MG PO CAPS: 100.0000 mg | ORAL_CAPSULE | Freq: Three times a day (TID) | ORAL | 5 refills | Status: DC

## 2019-11-08 NOTE — Progress Notes (Signed)
Subjective:    Patient ID: Gerald Jenkins, male    DOB: 11-21-1951, 68 y.o.   MRN: [id number]  HPI  Here for wellness and f/u;  Overall doing ok;  Pt denies Chest pain, worsening SOB, DOE, wheezing, orthopnea, PND, worsening LE edema, palpitations, dizziness or syncope.  Pt denies neurological change such as new headache, facial or extremity weakness.  Pt denies polydipsia, polyuria, or low sugar symptoms. Pt states overall good compliance with treatment and medications, good tolerability, and has been trying to follow appropriate diet.  Pt denies worsening depressive symptoms, suicidal ideation or panic. No fever, night sweats, wt loss, loss of appetite, or other constitutional symptoms.  Pt states good ability with ADL's, has low fall risk, home safety reviewed and adequate, no other significant changes in hearing or vision, and only occasionally active with exercise. Plans to work at least 1 more yr Also, Pt continues recurring right LBP x 6 wks, no bowel or bladder change, fever, wt loss,  worsening LE weakness, gait change or falls, but has distal pain and numbness on occasion.  Overall constant, mild, nothing seems to make better or worse thouthg somewhat worse in the AM, better later in the day.  He is ok with ordering MRi and gabapentin trial.  Also, Denies urinary symptoms such as dysuria, frequency, urgency, flank pain, hematuria or n/v, fever, chills. Past Medical History:  Diagnosis Date  . ANXIETY 01/19/2008  . Cervicalgia 01/19/2008  . DISORDER OF BONE AND CARTILAGE UNSPECIFIED 01/11/2008  . HYPERLIPIDEMIA 01/19/2008  . Melanoma ([place]) 09/09/2015  . 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 PSA, INCREASED 07/12/2010  . RASH-NONVESICULAR 01/19/2008   Past Surgical History:  Procedure Laterality Date  . CATARACT EXTRACTION    . lumbar disease      reports that he has been smoking. He has never used smokeless tobacco. He reports current alcohol use of about 10.0 standard drinks of alcohol per week. No  history on file for drug. family history includes Cancer in an other family member; Diabetes in an other family member; Lumbar disc disease in his father; Multiple sclerosis in an other family member. Allergies  Allergen Reactions  . Penicillins    Current Outpatient Medications on File Prior to Visit  Medication Sig Dispense Refill  . Ascorbic Acid (VITAMIN C PO) Take by mouth.    Marland Kitchen aspirin 81 MG tablet Take 81 mg by mouth daily.       No current facility-administered medications on file prior to visit.   ROS: Constitutional: Negative for other unusual diaphoresis, sweats, appetite or weight changes HENT: Negative for other worsening hearing loss, ear pain, facial swelling, mouth sores or neck stiffness.   Eyes: Negative for other worsening pain, redness or other visual disturbance.  Respiratory: Negative for other stridor or swelling Cardiovascular: Negative for other palpitations or other chest pain  Gastrointestinal: Negative for worsening diarrhea or loose stools, blood in stool, distention or other pain Genitourinary: Negative for hematuria, flank pain or other change in urine volume.  Musculoskeletal: Negative for myalgias or other joint swelling.  Skin: Negative for other color change, or other wound or worsening drainage.  Neurological: Negative for other syncope or numbness. Hematological: Negative for other adenopathy or swelling Psychiatric/Behavioral: Negative for hallucinations, other worsening agitation, SI, self-injury, or new decreased concentration All otherwise neg per pt     Objective:   Physical Exam BP (!) 142/90   Pulse 92   Temp 98.9 F (37.2 C) (Oral)   Ht 6'  1" (1.854 m)   Wt 181 lb (82.1 kg)   SpO2 98%   BMI 23.88 kg/m  VS noted,  Constitutional: Pt is oriented to person, place, and time. Appears well-developed and well-nourished, in no significant distress and comfortable Head: Normocephalic and atraumatic  Eyes: Conjunctivae and EOM are normal.  Pupils are equal, round, and reactive to light Right Ear: External ear normal without discharge Left Ear: External ear normal without discharge Nose: Nose without discharge or deformity Mouth/Throat: Oropharynx is without other ulcerations and moist  Neck: Normal range of motion. Neck supple. No JVD present. No tracheal deviation present or significant neck LA or mass Cardiovascular: Normal rate, regular rhythm, normal heart sounds and intact distal pulses.   Pulmonary/Chest: WOB normal and breath sounds without rales or wheezing  Abdominal: Soft. Bowel sounds are normal. NT. No HSM  Musculoskeletal: Normal range of motion. Exhibits no edema Lymphadenopathy: Has no other cervical adenopathy.  Neurological: Pt is alert and oriented to person, place, and time. Pt has normal reflexes. No cranial nerve deficit. Motor grossly intact, Gait intact Skin: Skin is warm and dry. No rash noted or new ulcerations Psychiatric:  Has normal mood and affect. Behavior is normal without agitation All otherwise neg per pt  Lab Results  Component Value Date   WBC 12.1 (H) 11/04/2019   HGB 13.9 11/04/2019   HCT 41.1 11/04/2019   PLT 375.0 11/04/2019   GLUCOSE 90 11/04/2019   CHOL 187 11/04/2019   TRIG 80.0 11/04/2019   HDL 65.20 11/04/2019   LDLDIRECT 66.4 07/09/2010   LDLCALC 105 (H) 11/04/2019   ALT 13 11/04/2019   AST 15 11/04/2019   NA 136 11/04/2019   K 4.8 11/04/2019   CL 98 11/04/2019   CREATININE 0.92 11/04/2019   BUN 10 11/04/2019   CO2 29 11/04/2019   TSH 3.27 11/04/2019   PSA 2.10 11/04/2019      Assessment & Plan:

## 2019-11-08 NOTE — Patient Instructions (Signed)
You had the Pneumovax and Tdap tetanus shots today  Please take all new medication as prescribed - the gabapentin  You will be contacted regarding the referral for: colonoscopy, MRI for the lower back, and [facility]  Please remember to come to the lab in 6 months (no appt needed) for repeat PSA  Please continue all other medications as before, and refills have been done if requested.  Please have the pharmacy call with any other refills you may need.  Please continue your efforts at being more active, low cholesterol diet, and weight control.  You are otherwise up to date with prevention measures today.  Please keep your appointments with your specialists as you may have planned  Please return in 1 year for your yearly visit, or sooner if needed, with Lab testing done 3-5 days before

## 2019-11-14 ENCOUNTER — Encounter: Payer: Self-pay | Admitting: Internal Medicine

## 2019-11-14 NOTE — Assessment & Plan Note (Signed)
For low chol diet, Lab Results  Component Value Date   LDLCALC 105 (H) 11/04/2019  declines statin change

## 2019-11-14 NOTE — Assessment & Plan Note (Signed)
Asympt, unclear clinical significance, for f/u psa at 6 mo

## 2019-11-14 NOTE — Assessment & Plan Note (Signed)

## 2019-11-14 NOTE — Assessment & Plan Note (Addendum)
Mod intermittent but worsening x 6 wks, for MRI LS spine, gabapentin, refer [facility]  In addition to the time spent performing CPE, I spent an additional 25 minutes face to face,in which greater than 50% of this time was spent in counseling and coordination of care for patient's acute illness as documented, including the differential dx, treatment, further evaluation and other management of right lumbar radiculopathy, HLD, increased psa velocity

## 2019-11-17 ENCOUNTER — Encounter: Payer: Self-pay | Admitting: Gastroenterology

## 2019-11-24 ENCOUNTER — Telehealth: Payer: Self-pay | Admitting: Internal Medicine

## 2019-11-24 NOTE — Telephone Encounter (Signed)
Copied from Day [id number]. Topic: General - Other >> Nov 19, 2019  3:51 PM Rainey Pines A wrote: Patient would like a callback today from [place] with clarification on why he was referred to [facility] and [facility]. Patient also would like to know when he should get his next pneumonia shot as well. Please advise

## 2019-11-24 NOTE — Telephone Encounter (Signed)
Pt wants to know why you referred him to [facility] when [facility] has their own MRI machine? He also wanted to know why didn't you wait to see the MRI result before doing additional referrals to see if he needed to see neurology instead?  Pt would also like to know when does he come back for another pneumonia vaccine. It looks like he had the pneumovax during his 11/15/2019 visit but was told to come back in in two weeks after his 11/06/2018 OV to have the prevnar13. Please advise.

## 2019-11-24 NOTE — Telephone Encounter (Signed)
Patient states that he will go through [facility] for eval and for MRI.  States he will not go to GI for MRI since both can be taken care of at [facility].   Patient still would like to know when he can get his next pneumonia vac.

## 2019-11-24 NOTE — Telephone Encounter (Signed)
Patient called 12/4 and CRM was first sent back.

## 2019-11-25 NOTE — Telephone Encounter (Signed)
I have nothing to offer about the MRI situation as I have no interest in promoting the financial health of one organization over another, and it makes no clinical difference as well  Very sorry if this was suggested by the [facility] MD, but I sure Gerald Jenkins will understand the financial interest of the [facility] in having the MRI done there  I reject the idea that it is difficult for the orthopedic to obtain MRI results done at another facility  Please refer all other concerns and questoins to [facility] Bigness, as I have nothing else to offer.  By the EMR record, Gerald Jenkins had the Pneumovax 23 done nov 23.  If this is error, please make a nurse visit appt to have this done  Otherwise, he is due for Prevnar 13 on or after Nov 07 2020

## 2019-11-25 NOTE — Telephone Encounter (Signed)
Pt has been informed and expressed understanding.  

## 2019-11-25 NOTE — Telephone Encounter (Signed)
Called pt, LVM for pt to call back to discuss.

## 2019-12-01 ENCOUNTER — Encounter: Payer: Self-pay | Admitting: Internal Medicine

## 2019-12-06 ENCOUNTER — Encounter: Payer: Self-pay | Admitting: *Deleted

## 2019-12-07 ENCOUNTER — Other Ambulatory Visit: Payer: Self-pay

## 2019-12-07 ENCOUNTER — Other Ambulatory Visit: Payer: BC Managed Care – PPO

## 2019-12-07 ENCOUNTER — Ambulatory Visit
Admission: RE | Admit: 2019-12-07 | Discharge: 2019-12-07 | Disposition: A | Discharge status: Home / Self Care | Payer: BC Managed Care – PPO | Source: Ambulatory Visit | Attending: Internal Medicine | Admitting: Internal Medicine

## 2019-12-07 DIAGNOSIS — M5416 Radiculopathy, lumbar region: Secondary | ICD-10-CM

## 2020-01-04 ENCOUNTER — Encounter: Payer: BC Managed Care – PPO | Admitting: Gastroenterology

## 2020-01-06 ENCOUNTER — Ambulatory Visit: Payer: BC Managed Care – PPO | Attending: Internal Medicine

## 2020-01-06 DIAGNOSIS — Z23 Encounter for immunization: Secondary | ICD-10-CM

## 2020-01-06 NOTE — Progress Notes (Signed)
   Covid-19 Vaccination Clinic  Name:  Gerald Jenkins    MRN: [id number] DOB: February 09, 1951  01/06/2020  [name] was observed post Covid-19 immunization for 15 minutes without incidence. He was provided with Vaccine Information Sheet and instruction to access the V-Safe system.   [name] was instructed to call 911 with any severe reactions post vaccine: Marland Kitchen Difficulty breathing  . Swelling of your face and throat  . A fast heartbeat  . A bad rash all over your body  . Dizziness and weakness    Immunizations Administered    Name Date Dose VIS Date Route   Pfizer COVID-19 Vaccine 01/06/2020  2:08 PM 0.3 mL 11/26/2019 Intramuscular   Manufacturer: [place]   Lot: [id number]   [place]: [id number]

## 2020-01-27 ENCOUNTER — Ambulatory Visit: Payer: BC Managed Care – PPO | Attending: Internal Medicine

## 2020-01-27 DIAGNOSIS — Z23 Encounter for immunization: Secondary | ICD-10-CM | POA: Insufficient documentation

## 2020-01-27 NOTE — Progress Notes (Signed)
   Covid-19 Vaccination Clinic  Name:  SCHUYLER MCLAWHORN    MRN: [id number] DOB: November 24, 1951  01/27/2020  Mr. Allums was observed post Covid-19 immunization for 15 minutes without incidence. He was provided with Vaccine Information Sheet and instruction to access the V-Safe system.   Mr. Klauser was instructed to call 911 with any severe reactions post vaccine: Marland Kitchen Difficulty breathing  . Swelling of your face and throat  . A fast heartbeat  . A bad rash all over your body  . Dizziness and weakness    Immunizations Administered    Name Date Dose VIS Date Route   Pfizer COVID-19 Vaccine 01/27/2020  3:33 PM 0.3 mL 11/26/2019 Intramuscular   Manufacturer: [place]   Lot: [id number]   [place]: [id number]

## 2020-02-01 ENCOUNTER — Encounter: Payer: BC Managed Care – PPO | Admitting: Internal Medicine

## 2020-08-29 DIAGNOSIS — M5136 Other intervertebral disc degeneration, lumbar region: Secondary | ICD-10-CM | POA: Insufficient documentation

## 2020-10-16 DIAGNOSIS — I639 Cerebral infarction, unspecified: Secondary | ICD-10-CM

## 2020-10-16 HISTORY — DX: Cerebral infarction, unspecified: I63.9

## 2020-10-20 DIAGNOSIS — M533 Sacrococcygeal disorders, not elsewhere classified: Secondary | ICD-10-CM | POA: Insufficient documentation

## 2020-11-06 ENCOUNTER — Other Ambulatory Visit (INDEPENDENT_AMBULATORY_CARE_PROVIDER_SITE_OTHER): Payer: BC Managed Care – PPO

## 2020-11-06 DIAGNOSIS — Z0000 Encounter for general adult medical examination without abnormal findings: Secondary | ICD-10-CM

## 2020-11-06 DIAGNOSIS — Z125 Encounter for screening for malignant neoplasm of prostate: Secondary | ICD-10-CM | POA: Diagnosis not present

## 2020-11-06 LAB — CBC WITH DIFFERENTIAL/PLATELET
Basophils Absolute: 0.1 10*3/uL (ref 0.0–0.1)
Basophils Relative: 1.3 % (ref 0.0–3.0)
Eosinophils Absolute: 0.2 10*3/uL (ref 0.0–0.7)
Eosinophils Relative: 2 % (ref 0.0–5.0)
HCT: 43.7 % (ref 39.0–52.0)
Hemoglobin: 14.9 g/dL (ref 13.0–17.0)
Lymphocytes Relative: 26.5 % (ref 12.0–46.0)
Lymphs Abs: 2.4 10*3/uL (ref 0.7–4.0)
MCHC: 34.2 g/dL (ref 30.0–36.0)
MCV: 90.2 fl (ref 78.0–100.0)
Monocytes Absolute: 0.9 10*3/uL (ref 0.1–1.0)
Monocytes Relative: 10.2 % (ref 3.0–12.0)
Neutro Abs: 5.5 10*3/uL (ref 1.4–7.7)
Neutrophils Relative %: 60 % (ref 43.0–77.0)
Platelets: 340 10*3/uL (ref 150.0–400.0)
RBC: 4.84 Mil/uL (ref 4.22–5.81)
RDW: 13.8 % (ref 11.5–15.5)
WBC: 9.1 10*3/uL (ref 4.0–10.5)

## 2020-11-06 LAB — BASIC METABOLIC PANEL
BUN: 16 mg/dL (ref 6–23)
CO2: 29 mEq/L (ref 19–32)
Calcium: 9.7 mg/dL (ref 8.4–10.5)
Chloride: 98 mEq/L (ref 96–112)
Creatinine, Ser: 1.16 mg/dL (ref 0.40–1.50)
GFR: 64.73 mL/min (ref >60.00)
Glucose, Bld: 78 mg/dL (ref 70–99)
Potassium: 4.5 mEq/L (ref 3.5–5.1)
Sodium: 135 mEq/L (ref 135–145)

## 2020-11-06 LAB — LIPID PANEL
Cholesterol: 275 mg/dL — ABNORMAL HIGH (ref 0–200)
HDL: 73.2 mg/dL (ref >39.00)
LDL Cholesterol: 183 mg/dL — ABNORMAL HIGH (ref 0–99)
NonHDL: 201.7
Total CHOL/HDL Ratio: 4
Triglycerides: 96 mg/dL (ref 0.0–149.0)
VLDL: 19.2 mg/dL (ref 0.0–40.0)

## 2020-11-06 LAB — URINALYSIS, ROUTINE W REFLEX MICROSCOPIC
Bilirubin Urine: NEGATIVE
Hgb urine dipstick: NEGATIVE
Ketones, ur: NEGATIVE
Leukocytes,Ua: NEGATIVE
Nitrite: NEGATIVE
RBC / HPF: NONE SEEN (ref 0–?)
Specific Gravity, Urine: 1.015 (ref 1.000–1.030)
Total Protein, Urine: NEGATIVE
Urine Glucose: NEGATIVE
Urobilinogen, UA: 0.2 (ref 0.0–1.0)
pH: 6.5 (ref 5.0–8.0)

## 2020-11-06 LAB — HEPATIC FUNCTION PANEL
ALT: 12 U/L (ref 0–53)
AST: 14 U/L (ref 0–37)
Albumin: 4.6 g/dL (ref 3.5–5.2)
Alkaline Phosphatase: 40 U/L (ref 39–117)
Bilirubin, Direct: 0.1 mg/dL (ref 0.0–0.3)
Total Bilirubin: 0.5 mg/dL (ref 0.2–1.2)
Total Protein: 7.3 g/dL (ref 6.0–8.3)

## 2020-11-06 LAB — TSH: TSH: 4.3 u[IU]/mL (ref 0.35–4.50)

## 2020-11-06 LAB — PSA: PSA: 0.54 ng/mL (ref 0.10–4.00)

## 2020-11-07 ENCOUNTER — Other Ambulatory Visit: Payer: Self-pay

## 2020-11-08 ENCOUNTER — Encounter: Payer: Self-pay | Admitting: Internal Medicine

## 2020-11-08 ENCOUNTER — Ambulatory Visit (INDEPENDENT_AMBULATORY_CARE_PROVIDER_SITE_OTHER): Payer: BC Managed Care – PPO | Admitting: Internal Medicine

## 2020-11-08 VITALS — BP 160/100 | HR 80 | Temp 98.3°F | Ht 73.0 in | Wt 173.0 lb

## 2020-11-08 DIAGNOSIS — Z1211 Encounter for screening for malignant neoplasm of colon: Secondary | ICD-10-CM | POA: Diagnosis not present

## 2020-11-08 DIAGNOSIS — Z0001 Encounter for general adult medical examination with abnormal findings: Secondary | ICD-10-CM

## 2020-11-08 DIAGNOSIS — R030 Elevated blood-pressure reading, without diagnosis of hypertension: Secondary | ICD-10-CM

## 2020-11-08 DIAGNOSIS — M48061 Spinal stenosis, lumbar region without neurogenic claudication: Secondary | ICD-10-CM | POA: Diagnosis not present

## 2020-11-08 DIAGNOSIS — R972 Elevated prostate specific antigen [PSA]: Secondary | ICD-10-CM

## 2020-11-08 DIAGNOSIS — Z0000 Encounter for general adult medical examination without abnormal findings: Secondary | ICD-10-CM | POA: Diagnosis not present

## 2020-11-08 DIAGNOSIS — E785 Hyperlipidemia, unspecified: Secondary | ICD-10-CM | POA: Diagnosis not present

## 2020-11-08 DIAGNOSIS — Z23 Encounter for immunization: Secondary | ICD-10-CM

## 2020-11-08 HISTORY — DX: Spinal stenosis, lumbar region without neurogenic claudication: M48.061

## 2020-11-08 MED ORDER — SILDENAFIL CITRATE 100 MG PO TABS: ORAL_TABLET | ORAL | 11 refills | Status: DC

## 2020-11-08 MED ORDER — ROSUVASTATIN CALCIUM 40 MG PO TABS: 20.0000 mg | ORAL_TABLET | Freq: Every day | ORAL | 11 refills | Status: DC

## 2020-11-08 NOTE — Patient Instructions (Addendum)
You had the flu shot, and Prevnar 13 pneumonia shots today  We have discussed the Cardiac CT Score test to measure the calcification level (if any) in your heart arteries.  This test has been ordered in our [facility], so please call [facility] directly, as they prefer this, at [phone] to be scheduled.  Please continue all other medications as before, and refills have been done  Please have the pharmacy call with any other refills you may need.  Please continue your efforts at being more active, low cholesterol diet, and weight control.  You are otherwise up to date with prevention measures today.  Please keep your appointments with your specialists as you may have planned  You will be contacted regarding the referral for: colonoscopy  Please make an Appointment to return for your 1 year visit, or sooner if needed, with Lab testing by Appointment as well, to be done about 3-5 days before at the [facility] (so this is for TWO appointments - please see the scheduling desk as you leave)  Due to the ongoing Covid 19 pandemic, our lab now requires an appointment for any labs done at our office.  If you need labs done and do not have an appointment, please call our office ahead of time to schedule before presenting to the lab for your testing.

## 2020-11-08 NOTE — Progress Notes (Addendum)
Subjective:    Patient ID: Gerald Jenkins, male    DOB: 09/10/1951, 69 y.o.   MRN: [id number]  HPI  Here for wellness and f/u;  Overall doing ok;  Pt denies Chest pain, worsening SOB, DOE, wheezing, orthopnea, PND, worsening LE edema, palpitations, dizziness or syncope.  Pt denies neurological change such as new headache, facial or extremity weakness.  Pt denies polydipsia, polyuria, or low sugar symptoms. Pt states overall good compliance with treatment and medications, good tolerability, and has been trying to follow appropriate diet.  Pt denies worsening depressive symptoms, suicidal ideation or panic. No fever, night sweats, wt loss, loss of appetite, or other constitutional symptoms.  Pt states good ability with ADL's, has low fall risk, home safety reviewed and adequate, no other significant changes in hearing or vision, and only occasionally active with exercise.  S/p ESI x 3 with last one earlier today.  Plans to have the covid booster soon.  Has not been checking BP at home, but willing to start.  Has not been taking statin as rx.  Due for flu and prevnar 13.  Willing for card Ct score testsing.  Pt continues to have recurring LBP without change in severity, bowel or bladder change, fever, wt loss,  worsening LE pain/numbness/weakness, gait change or falls. Past Medical History:  Diagnosis Date  . ANXIETY 01/19/2008  . Cervicalgia 01/19/2008  . DISORDER OF BONE AND CARTILAGE UNSPECIFIED 01/11/2008  . HYPERLIPIDEMIA 01/19/2008  . Lumbar spinal stenosis 11/08/2020  . Melanoma ([place]) 09/09/2015  . 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 PSA, INCREASED 07/12/2010  . RASH-NONVESICULAR 01/19/2008   Past Surgical History:  Procedure Laterality Date  . CATARACT EXTRACTION    . lumbar disease      reports that he has been smoking. He has never used smokeless tobacco. He reports current alcohol use of about 10.0 standard drinks of alcohol per week. No history on file for drug use. family history includes Cancer  in an other family member; Diabetes in an other family member; Lumbar disc disease in his father; Multiple sclerosis in an other family member. Allergies  Allergen Reactions  . Penicillins Other (See Comments)    Childhood allergy   No current facility-administered medications on file prior to visit.   Current Outpatient Medications on File Prior to Visit  Medication Sig Dispense Refill  . Influenza vac split quadrivalent PF (FLUZONE HIGH-DOSE) 0.5 ML injection Fluzone High-Dose 2018-2019 (PF) 180 mcg/0.5 mL intramuscular syringe  ADM 0.5ML IM UTD    . pregabalin (LYRICA) 75 MG capsule Take 75 mg by mouth 2 (two) times daily.      Review of Systems All otherwise neg per pt     Objective:   Physical Exam BP (!) 160/100 (BP Location: Left Arm, Patient Position: Sitting, Cuff Size: Large)   Pulse 80   Temp 98.3 F (36.8 C) (Oral)   Ht 6\' 1"  (1.854 m)   Wt 173 lb (78.5 kg)   SpO2 97%   BMI 22.82 kg/m  VS noted,  Constitutional: Pt appears in NAD HENT: Head: NCAT.  Right Ear: External ear normal.  Left Ear: External ear normal.  Eyes: . Pupils are equal, round, and reactive to light. Conjunctivae and EOM are normal Nose: without d/c or deformity Neck: Neck supple. Gross normal ROM Cardiovascular: Normal rate and regular rhythm.   Pulmonary/Chest: Effort normal and breath sounds without rales or wheezing.  Abd:  Soft, NT, ND, + BS, no organomegaly Neurological: Pt is alert.  At baseline orientation, motor grossly intact Skin: Skin is warm. No rashes, other new lesions, no LE edema Psychiatric: Pt behavior is normal without agitation  All otherwise neg per pt Lab Results  Component Value Date   WBC 14.3 (H) 11/09/2020   HGB 14.2 11/09/2020   HCT 40.8 11/09/2020   PLT 286 11/09/2020   GLUCOSE 109 (H) 11/09/2020   CHOL 275 (H) 11/06/2020   TRIG 96.0 11/06/2020   HDL 73.20 11/06/2020   LDLDIRECT 66.4 07/09/2010   LDLCALC 183 (H) 11/06/2020   ALT 18 11/09/2020   AST 24  11/09/2020   NA 134 (L) 11/09/2020   K 4.0 11/09/2020   CL 96 (L) 11/09/2020   CREATININE 1.00 11/09/2020   BUN 25 (H) 11/09/2020   CO2 24 11/09/2020   TSH 4.30 11/06/2020   PSA 0.54 11/06/2020   INR 0.9 11/09/2020      Assessment & Plan:

## 2020-11-09 ENCOUNTER — Other Ambulatory Visit: Payer: Self-pay

## 2020-11-09 ENCOUNTER — Observation Stay (HOSPITAL_COMMUNITY)
Admission: EM | Admit: 2020-11-09 | Discharge: 2020-11-10 | Disposition: A | Discharge status: Home / Self Care | Payer: BC Managed Care – PPO | Attending: Family Medicine | Admitting: Family Medicine

## 2020-11-09 ENCOUNTER — Encounter: Payer: Self-pay | Admitting: Internal Medicine

## 2020-11-09 ENCOUNTER — Encounter (HOSPITAL_COMMUNITY): Payer: Self-pay | Admitting: Obstetrics and Gynecology

## 2020-11-09 ENCOUNTER — Observation Stay (HOSPITAL_COMMUNITY): Payer: BC Managed Care – PPO

## 2020-11-09 ENCOUNTER — Emergency Department (HOSPITAL_COMMUNITY): Payer: BC Managed Care – PPO

## 2020-11-09 DIAGNOSIS — R29810 Facial weakness: Secondary | ICD-10-CM | POA: Insufficient documentation

## 2020-11-09 DIAGNOSIS — Z720 Tobacco use: Secondary | ICD-10-CM | POA: Diagnosis not present

## 2020-11-09 DIAGNOSIS — Z20822 Contact with and (suspected) exposure to covid-19: Secondary | ICD-10-CM | POA: Diagnosis not present

## 2020-11-09 DIAGNOSIS — E871 Hypo-osmolality and hyponatremia: Secondary | ICD-10-CM | POA: Diagnosis not present

## 2020-11-09 DIAGNOSIS — F17200 Nicotine dependence, unspecified, uncomplicated: Secondary | ICD-10-CM | POA: Insufficient documentation

## 2020-11-09 DIAGNOSIS — I6330 Cerebral infarction due to thrombosis of unspecified cerebral artery: Secondary | ICD-10-CM | POA: Insufficient documentation

## 2020-11-09 DIAGNOSIS — R531 Weakness: Principal | ICD-10-CM

## 2020-11-09 DIAGNOSIS — E785 Hyperlipidemia, unspecified: Secondary | ICD-10-CM | POA: Diagnosis not present

## 2020-11-09 DIAGNOSIS — F1010 Alcohol abuse, uncomplicated: Secondary | ICD-10-CM | POA: Diagnosis not present

## 2020-11-09 DIAGNOSIS — I10 Essential (primary) hypertension: Secondary | ICD-10-CM | POA: Insufficient documentation

## 2020-11-09 DIAGNOSIS — R2689 Other abnormalities of gait and mobility: Secondary | ICD-10-CM | POA: Diagnosis present

## 2020-11-09 DIAGNOSIS — Z87891 Personal history of nicotine dependence: Secondary | ICD-10-CM

## 2020-11-09 DIAGNOSIS — R030 Elevated blood-pressure reading, without diagnosis of hypertension: Secondary | ICD-10-CM | POA: Insufficient documentation

## 2020-11-09 LAB — DIFFERENTIAL
Abs Immature Granulocytes: 0.04 10*3/uL (ref 0.00–0.07)
Basophils Absolute: 0 10*3/uL (ref 0.0–0.1)
Basophils Relative: 0 %
Eosinophils Absolute: 0 10*3/uL (ref 0.0–0.5)
Eosinophils Relative: 0 %
Immature Granulocytes: 0 %
Lymphocytes Relative: 12 %
Lymphs Abs: 1.8 10*3/uL (ref 0.7–4.0)
Monocytes Absolute: 1.3 10*3/uL — ABNORMAL HIGH (ref 0.1–1.0)
Monocytes Relative: 9 %
Neutro Abs: 11.1 10*3/uL — ABNORMAL HIGH (ref 1.7–7.7)
Neutrophils Relative %: 79 %

## 2020-11-09 LAB — PROTIME-INR
INR: 0.9 (ref 0.8–1.2)
Prothrombin Time: 11.8 seconds (ref 11.4–15.2)

## 2020-11-09 LAB — RAPID URINE DRUG SCREEN, HOSP PERFORMED
Amphetamines: NOT DETECTED
Barbiturates: NOT DETECTED
Benzodiazepines: NOT DETECTED
Cocaine: NOT DETECTED
Opiates: NOT DETECTED
Tetrahydrocannabinol: NOT DETECTED

## 2020-11-09 LAB — CBC
HCT: 40.8 % (ref 39.0–52.0)
Hemoglobin: 14.2 g/dL (ref 13.0–17.0)
MCH: 31.4 pg (ref 26.0–34.0)
MCHC: 34.8 g/dL (ref 30.0–36.0)
MCV: 90.3 fL (ref 80.0–100.0)
Platelets: 286 10*3/uL (ref 150–400)
RBC: 4.52 MIL/uL (ref 4.22–5.81)
RDW: 12.9 % (ref 11.5–15.5)
WBC: 14.3 10*3/uL — ABNORMAL HIGH (ref 4.0–10.5)
nRBC: 0 % (ref 0.0–0.2)

## 2020-11-09 LAB — URINALYSIS, ROUTINE W REFLEX MICROSCOPIC
Bilirubin Urine: NEGATIVE
Glucose, UA: NEGATIVE mg/dL
Hgb urine dipstick: NEGATIVE
Ketones, ur: NEGATIVE mg/dL
Leukocytes,Ua: NEGATIVE
Nitrite: NEGATIVE
Protein, ur: NEGATIVE mg/dL
Specific Gravity, Urine: 1.021 (ref 1.005–1.030)
pH: 6 (ref 5.0–8.0)

## 2020-11-09 LAB — I-STAT CHEM 8, ED
BUN: 25 mg/dL — ABNORMAL HIGH (ref 8–23)
Calcium, Ion: 1.2 mmol/L (ref 1.15–1.40)
Chloride: 96 mmol/L — ABNORMAL LOW (ref 98–111)
Creatinine, Ser: 1 mg/dL (ref 0.61–1.24)
Glucose, Bld: 109 mg/dL — ABNORMAL HIGH (ref 70–99)
HCT: 46 % (ref 39.0–52.0)
Hemoglobin: 15.6 g/dL (ref 13.0–17.0)
Potassium: 4 mmol/L (ref 3.5–5.1)
Sodium: 134 mmol/L — ABNORMAL LOW (ref 135–145)
TCO2: 27 mmol/L (ref 22–32)

## 2020-11-09 LAB — COMPREHENSIVE METABOLIC PANEL
ALT: 18 U/L (ref 0–44)
AST: 24 U/L (ref 15–41)
Albumin: 5 g/dL (ref 3.5–5.0)
Alkaline Phosphatase: 42 U/L (ref 38–126)
Anion gap: 12 (ref 5–15)
BUN: 19 mg/dL (ref 8–23)
CO2: 24 mmol/L (ref 22–32)
Calcium: 9.7 mg/dL (ref 8.9–10.3)
Chloride: 96 mmol/L — ABNORMAL LOW (ref 98–111)
Creatinine, Ser: 1.12 mg/dL (ref 0.61–1.24)
GFR, Estimated: >60 mL/min (ref >60)
Glucose, Bld: 106 mg/dL — ABNORMAL HIGH (ref 70–99)
Potassium: 3.6 mmol/L (ref 3.5–5.1)
Sodium: 132 mmol/L — ABNORMAL LOW (ref 135–145)
Total Bilirubin: 0.4 mg/dL (ref 0.3–1.2)
Total Protein: 8.4 g/dL — ABNORMAL HIGH (ref 6.5–8.1)

## 2020-11-09 LAB — PHOSPHORUS: Phosphorus: 2.7 mg/dL (ref 2.5–4.6)

## 2020-11-09 LAB — RESP PANEL BY RT-PCR (FLU A&B, COVID) ARPGX2
Influenza A by PCR: NEGATIVE
Influenza B by PCR: NEGATIVE
SARS Coronavirus 2 by RT PCR: NEGATIVE

## 2020-11-09 LAB — APTT: aPTT: 24 seconds (ref 24–36)

## 2020-11-09 LAB — HEMOGLOBIN A1C
Hgb A1c MFr Bld: 5.5 % (ref 4.8–5.6)
Mean Plasma Glucose: 111.15 mg/dL

## 2020-11-09 LAB — ETHANOL: Alcohol, Ethyl (B): <10 mg/dL (ref <10)

## 2020-11-09 LAB — MAGNESIUM: Magnesium: 2.2 mg/dL (ref 1.7–2.4)

## 2020-11-09 MED ORDER — THIAMINE HCL 100 MG PO TABS
100.0000 mg | ORAL_TABLET | Freq: Every day | ORAL | Status: DC
  Administered 2020-11-09 – 2020-11-10 (×2): 100 mg via ORAL
  Filled 2020-11-09 (×2): qty 1

## 2020-11-09 MED ORDER — ACETAMINOPHEN 160 MG/5ML PO SOLN: 650.0000 mg | ORAL | Status: DC | PRN

## 2020-11-09 MED ORDER — ACETAMINOPHEN 650 MG RE SUPP: 650.0000 mg | RECTAL | Status: DC | PRN

## 2020-11-09 MED ORDER — PREGABALIN 75 MG PO CAPS
75.0000 mg | ORAL_CAPSULE | Freq: Two times a day (BID) | ORAL | Status: DC
  Administered 2020-11-09 – 2020-11-10 (×2): 75 mg via ORAL
  Filled 2020-11-09 (×2): qty 1

## 2020-11-09 MED ORDER — STROKE: EARLY STAGES OF RECOVERY BOOK: Freq: Once | Status: DC

## 2020-11-09 MED ORDER — ASPIRIN EC 81 MG PO TBEC
81.0000 mg | DELAYED_RELEASE_TABLET | Freq: Every day | ORAL | Status: DC
  Administered 2020-11-09 – 2020-11-10 (×2): 81 mg via ORAL
  Filled 2020-11-09 (×2): qty 1

## 2020-11-09 MED ORDER — LORAZEPAM 2 MG/ML IJ SOLN: 1.0000 mg | INTRAMUSCULAR | Status: DC | PRN

## 2020-11-09 MED ORDER — ACETAMINOPHEN 500 MG PO TABS: 1000.0000 mg | ORAL_TABLET | Freq: Four times a day (QID) | ORAL | Status: DC | PRN

## 2020-11-09 MED ORDER — LORAZEPAM 1 MG PO TABS: 1.0000 mg | ORAL_TABLET | ORAL | Status: DC | PRN

## 2020-11-09 MED ORDER — IOHEXOL 350 MG/ML SOLN
100.0000 mL | Freq: Once | INTRAVENOUS | Status: AC | PRN
  Administered 2020-11-09: 100 mL via INTRAVENOUS

## 2020-11-09 MED ORDER — IOHEXOL 350 MG/ML SOLN: 80.0000 mL | Freq: Once | INTRAVENOUS | Status: DC | PRN

## 2020-11-09 MED ORDER — THIAMINE HCL 100 MG/ML IJ SOLN: 100.0000 mg | Freq: Every day | INTRAMUSCULAR | Status: DC

## 2020-11-09 MED ORDER — SODIUM CHLORIDE (PF) 0.9 % IJ SOLN
INTRAMUSCULAR | Status: AC
  Filled 2020-11-09: qty 50

## 2020-11-09 MED ORDER — ACETAMINOPHEN 325 MG PO TABS: 650.0000 mg | ORAL_TABLET | ORAL | Status: DC | PRN

## 2020-11-09 MED ORDER — FOLIC ACID 1 MG PO TABS
1.0000 mg | ORAL_TABLET | Freq: Every day | ORAL | Status: DC
  Administered 2020-11-09 – 2020-11-10 (×2): 1 mg via ORAL
  Filled 2020-11-09 (×2): qty 1

## 2020-11-09 MED ORDER — SODIUM CHLORIDE 0.9 % IV SOLN: INTRAVENOUS | Status: AC

## 2020-11-09 MED ORDER — ROSUVASTATIN CALCIUM 20 MG PO TABS
40.0000 mg | ORAL_TABLET | Freq: Every day | ORAL | Status: DC
  Administered 2020-11-09 – 2020-11-10 (×2): 40 mg via ORAL
  Filled 2020-11-09 (×2): qty 2

## 2020-11-09 MED ORDER — ENOXAPARIN SODIUM 40 MG/0.4ML ~~LOC~~ SOLN
40.0000 mg | SUBCUTANEOUS | Status: DC
  Administered 2020-11-09: 40 mg via SUBCUTANEOUS
  Filled 2020-11-09: qty 0.4

## 2020-11-09 MED ORDER — ADULT MULTIVITAMIN W/MINERALS CH
1.0000 | ORAL_TABLET | Freq: Every day | ORAL | Status: DC
  Administered 2020-11-09 – 2020-11-10 (×2): 1 via ORAL
  Filled 2020-11-09 (×2): qty 1

## 2020-11-09 NOTE — ED Provider Notes (Signed)
[facility] DEPT Provider Note   CSN: [id number] Arrival date & time: 11/09/20  1234     History Chief Complaint  Patient presents with  . Weakness  . Fall    Gerald Jenkins is a 69 y.o. male with a past medical history significant for anxiety and hyperlipidemia who presents to the ED due to balance issues and fall. Patient states he fell last night around 11:15PM when he was cleaning, but felt his normal self. Last known well 11:30 when patient went to bed. Patient notes he woke up around 9AM and felt off balance like he was leaning towards the right. He also admits to associated speech changes which he describes as "feeling weaker". Admits to drinking frequent alcohol roughly 5-6 nights a week. Stopped taking his hyperlipidemia medication 6 months ago. Admits to "socially" using tobacco. Denies head injury. No previous strokes. Denies headache. Denies chest pain, shortness of breath, abdominal pain, nausea, vomiting, and diarrhea.  History obtained from patient and past medical records. No interpreter used during encounter.      Past Medical History:  Diagnosis Date  . ANXIETY 01/19/2008  . Cervicalgia 01/19/2008  . DISORDER OF BONE AND CARTILAGE UNSPECIFIED 01/11/2008  . HYPERLIPIDEMIA 01/19/2008  . Lumbar spinal stenosis 11/08/2020  . Melanoma ([place]) 09/09/2015  . 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 PSA, INCREASED 07/12/2010  . RASH-NONVESICULAR 01/19/2008    Patient Active Problem List   Diagnosis Date Noted  . Lumbar spinal stenosis 11/08/2020  . Sacroiliac joint pain 10/20/2020  . 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 Right lumbar radiculopathy 11/08/2019  . Cubital tunnel syndrome 12/29/2017  . 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 LLQ abdominal mass 10/06/2016  . Melanoma ([place]) 09/09/2015  . 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 Encounter for well adult exam with abnormal findings 07/21/2011  . PSA, INCREASED 07/12/2010  . HLD  (hyperlipidemia) 01/19/2008  . ANXIETY 01/19/2008  . Cervicalgia 01/19/2008    Past Surgical History:  Procedure Laterality Date  . CATARACT EXTRACTION    . lumbar disease         Family History  Problem Relation Age of Onset  . Lumbar disc disease Father   . Multiple sclerosis Other   . Diabetes Other   . Cancer Other        lung cancer    Social History   Tobacco Use  . Smoking status: Current Some Day Smoker  . Smokeless tobacco: Never Used  Substance Use Topics  . Alcohol use: Yes    Alcohol/week: 10.0 standard drinks    Types: 10 Standard drinks or equivalent per week  . Drug use: Not on file    Home Medications Prior to Admission medications   Medication Sig Start Date End Date Taking? Authorizing Provider  acetaminophen (TYLENOL) 500 MG tablet Take 1,000 mg by mouth every 6 (six) hours as needed for mild pain.   Yes [provider]  pregabalin (LYRICA) 75 MG capsule Take 75 mg by mouth 2 (two) times daily.    Yes [provider]  rosuvastatin (CRESTOR) 40 MG tablet Take 0.5 tablets (20 mg total) by mouth daily. 11/08/20  Yes Biagio Borg, MD  sildenafil (VIAGRA) 100 MG tablet TAKE 1 TABLET BY MOUTH EVERY OTHER DAY AS NEEDED Patient taking differently: Take 100 mg by mouth as needed for erectile dysfunction.  11/08/20  Yes Biagio Borg, MD  Influenza vac split quadrivalent PF (FLUZONE HIGH-DOSE) 0.5 ML injection Fluzone High-Dose 2018-2019 (PF)  180 mcg/0.5 mL intramuscular syringe  ADM 0.5ML IM UTD    [provider]    Allergies    Penicillins  Review of Systems   Review of Systems  Constitutional: Negative for chills and fever.  Respiratory: Negative for shortness of breath.   Cardiovascular: Negative for chest pain.  Gastrointestinal: Negative for abdominal pain.  Neurological: Positive for facial asymmetry, speech difficulty and weakness. Negative for headaches.  All other systems reviewed and are negative.   Physical  Exam Updated Vital Signs BP (!) 167/100   Pulse 78   Temp 98.3 F (36.8 C)   Resp 18   SpO2 99%   Physical Exam Vitals and nursing note reviewed.  Constitutional:      General: He is not in acute distress.    Appearance: He is not toxic-appearing.  HENT:     Head: Normocephalic.  Eyes:     Extraocular Movements: Extraocular movements intact.     Pupils: Pupils are equal, round, and reactive to light.  Cardiovascular:     Rate and Rhythm: Normal rate and regular rhythm.     Pulses: Normal pulses.     Heart sounds: Normal heart sounds. No murmur heard.  No friction rub. No gallop.   Pulmonary:     Effort: Pulmonary effort is normal.     Breath sounds: Normal breath sounds.  Abdominal:     General: Abdomen is flat. There is no distension.     Palpations: Abdomen is soft.     Tenderness: There is no abdominal tenderness. There is no guarding or rebound.  Musculoskeletal:     Cervical back: Neck supple.  Skin:    General: Skin is warm and dry.  Neurological:     Mental Status: He is alert.     Comments: Speech is clear, able to follow commands Right sided facial droop Mild decrease in strength on right side Right sided limb drift Right limb ataxia  Psychiatric:        Mood and Affect: Mood normal.        Behavior: Behavior normal.     ED Results / Procedures / Treatments   Labs (all labs ordered are listed, but only abnormal results are displayed) Labs Reviewed  COMPREHENSIVE METABOLIC PANEL - Abnormal; Notable for the following components:      Result Value   Sodium 132 (*)    Chloride 96 (*)    Glucose, Bld 106 (*)    Total Protein 8.4 (*)    All other components within normal limits  CBC - Abnormal; Notable for the following components:   WBC 14.3 (*)    All other components within normal limits  DIFFERENTIAL - Abnormal; Notable for the following components:   Neutro Abs 11.1 (*)    Monocytes Absolute 1.3 (*)    All other components within normal limits    I-STAT CHEM 8, ED - Abnormal; Notable for the following components:   Sodium 134 (*)    Chloride 96 (*)    BUN 25 (*)    Glucose, Bld 109 (*)    All other components within normal limits  RESP PANEL BY RT-PCR (FLU A&B, COVID) ARPGX2  ETHANOL  PROTIME-INR  APTT  RAPID URINE DRUG SCREEN, HOSP PERFORMED  URINALYSIS, ROUTINE W REFLEX MICROSCOPIC    EKG EKG Interpretation  Date/Time:  Thursday November 09 2020 13:04:23 EST Ventricular Rate:  96 PR Interval:    QRS Duration: 86 QT Interval:  355 QTC Calculation: 449 R  Axis:   42 Text Interpretation: Sinus rhythm Biatrial enlargement Borderline ST depression, diffuse leads No old tracing to compare Confirmed by Pattricia Boss [phone]) on 11/09/2020 1:11:58 PM   Radiology CT Angio Head W or Wo Contrast  Result Date: 11/09/2020 CLINICAL DATA:  Patient fell yesterday. Awoke today with right-sided weakness, right-sided facial droop and slurred speech. The folks going on her arm and EXAM: CT ANGIOGRAPHY HEAD AND NECK CT PERFUSION BRAIN TECHNIQUE: Multidetector CT imaging of the head and neck was performed using the standard protocol during bolus administration of intravenous contrast. Multiplanar CT image reconstructions and MIPs were obtained to evaluate the vascular anatomy. Carotid stenosis measurements (when applicable) are obtained utilizing NASCET criteria, using the distal internal carotid diameter as the denominator. Multiphase CT imaging of the brain was performed following IV bolus contrast injection. Subsequent parametric perfusion maps were calculated using RAPID software. CONTRAST:  163mL OMNIPAQUE IOHEXOL 350 MG/ML SOLN COMPARISON:  None. FINDINGS: CT HEAD FINDINGS Brain: No acute CT finding. Chronic small-vessel ischemic changes of the cerebral hemispheric white matter. 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 No sign of acute infarction, mass lesion, hemorrhage, hydrocephalus or extra-axial collection.  Vascular: There is atherosclerotic calcification of the major vessels at the base of the brain. Skull: Negative Sinuses/Orbits: Clear/normal Other: None ASPECTS ([place] Stroke Program Early CT Score) - Ganglionic level infarction (caudate, lentiform nuclei, internal capsule, insula, M1-M3 cortex): 7 - Supraganglionic infarction (M4-M6 cortex): 3 Total score (0-10 with 10 being normal): 10 Review of the MIP images confirms the above findings CTA NECK FINDINGS Aortic arch: Normal Right carotid system: Common carotid artery widely patent to the bifurcation. Mild calcified plaque at the carotid bifurcation and ICA bulb but no stenosis. Cervical ICA widely patent. Left carotid system: Common carotid artery widely patent to the bifurcation. Calcified plaque at the ICA bulb. No stenosis. Vertebral arteries: Both vertebral artery origins are widely patent. The right vertebral artery is dominant. Both vertebral arteries appear normal through the cervical region to the foramen magnum. Skeleton: Ordinary cervical spondylosis. Other neck: No soft tissue lesion. Upper chest: Normal Review of the MIP images confirms the above findings CTA HEAD FINDINGS Anterior circulation: Both internal carotid arteries are widely patent through the skull base and siphon regions. Ordinary siphon atherosclerotic calcification but without stenosis greater than 30%. The anterior and middle cerebral vessels are patent without large or medium vessel occlusion, proximal stenosis, aneurysm or vascular malformation. Posterior circulation: Both vertebral arteries are patent through the foramen magnum. The left terminates in PICA. The right vertebral artery supplies the basilar. No basilar stenosis. Posterior circulation branch vessels are patent. Venous sinuses: Patent and normal. Anatomic variants: None significant. Review of the MIP images confirms the above findings CT Brain Perfusion Findings: ASPECTS: 10 CBF (<30%) Volume: 63mL Perfusion (Tmax>6.0s)  volume: 30mL Mismatch Volume: 60mL Infarction Location:None IMPRESSION: 1. No acute finding. Chronic small-vessel ischemic changes of the white matter. 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 2. No large or medium vessel occlusion. 3. Mild nonstenotic atherosclerotic disease at both carotid bifurcations. 4. Normal perfusion study. 5. These results were called by telephone at the time of interpretation on 11/09/2020 at 3:07 pm to provider [facility] , who verbally acknowledged these results. Electronically Signed   By: Nelson Chimes M.D.   On: 11/09/2020 15:08   CT ANGIO NECK W OR WO CONTRAST  Result Date: 11/09/2020 CLINICAL DATA:  Patient fell yesterday. Awoke today with right-sided weakness, right-sided facial droop and  slurred speech. The folks going on her arm and EXAM: CT ANGIOGRAPHY HEAD AND NECK CT PERFUSION BRAIN TECHNIQUE: Multidetector CT imaging of the head and neck was performed using the standard protocol during bolus administration of intravenous contrast. Multiplanar CT image reconstructions and MIPs were obtained to evaluate the vascular anatomy. Carotid stenosis measurements (when applicable) are obtained utilizing NASCET criteria, using the distal internal carotid diameter as the denominator. Multiphase CT imaging of the brain was performed following IV bolus contrast injection. Subsequent parametric perfusion maps were calculated using RAPID software. CONTRAST:  142mL OMNIPAQUE IOHEXOL 350 MG/ML SOLN COMPARISON:  None. FINDINGS: CT HEAD FINDINGS Brain: No acute CT finding. Chronic small-vessel ischemic changes of the cerebral hemispheric white matter. 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 No sign of acute infarction, mass lesion, hemorrhage, hydrocephalus or extra-axial collection. Vascular: There is atherosclerotic calcification of the major vessels at the base of the brain. Skull: Negative Sinuses/Orbits: Clear/normal Other: None ASPECTS  ([place] Stroke Program Early CT Score) - Ganglionic level infarction (caudate, lentiform nuclei, internal capsule, insula, M1-M3 cortex): 7 - Supraganglionic infarction (M4-M6 cortex): 3 Total score (0-10 with 10 being normal): 10 Review of the MIP images confirms the above findings CTA NECK FINDINGS Aortic arch: Normal Right carotid system: Common carotid artery widely patent to the bifurcation. Mild calcified plaque at the carotid bifurcation and ICA bulb but no stenosis. Cervical ICA widely patent. Left carotid system: Common carotid artery widely patent to the bifurcation. Calcified plaque at the ICA bulb. No stenosis. Vertebral arteries: Both vertebral artery origins are widely patent. The right vertebral artery is dominant. Both vertebral arteries appear normal through the cervical region to the foramen magnum. Skeleton: Ordinary cervical spondylosis. Other neck: No soft tissue lesion. Upper chest: Normal Review of the MIP images confirms the above findings CTA HEAD FINDINGS Anterior circulation: Both internal carotid arteries are widely patent through the skull base and siphon regions. Ordinary siphon atherosclerotic calcification but without stenosis greater than 30%. The anterior and middle cerebral vessels are patent without large or medium vessel occlusion, proximal stenosis, aneurysm or vascular malformation. Posterior circulation: Both vertebral arteries are patent through the foramen magnum. The left terminates in PICA. The right vertebral artery supplies the basilar. No basilar stenosis. Posterior circulation branch vessels are patent. Venous sinuses: Patent and normal. Anatomic variants: None significant. Review of the MIP images confirms the above findings CT Brain Perfusion Findings: ASPECTS: 10 CBF (<30%) Volume: 60mL Perfusion (Tmax>6.0s) volume: 20mL Mismatch Volume: 79mL Infarction Location:None IMPRESSION: 1. No acute finding. Chronic small-vessel ischemic changes of the white matter. Old  lacunar infarctions right thalamus and right caudate body. 2. No large or medium vessel occlusion. 3. Mild nonstenotic atherosclerotic disease at both carotid bifurcations. 4. Normal perfusion study. 5. These results were called by telephone at the time of interpretation on 11/09/2020 at 3:07 pm to provider [facility] , who verbally acknowledged these results. Electronically Signed   By: Nelson Chimes M.D.   On: 11/09/2020 15:08   CT CEREBRAL PERFUSION W CONTRAST  Result Date: 11/09/2020 CLINICAL DATA:  Patient fell yesterday. Awoke today with right-sided weakness, right-sided facial droop and slurred speech. The folks going on her arm and EXAM: CT ANGIOGRAPHY HEAD AND NECK CT PERFUSION BRAIN TECHNIQUE: Multidetector CT imaging of the head and neck was performed using the standard protocol during bolus administration of intravenous contrast. Multiplanar CT image reconstructions and MIPs were obtained to evaluate the vascular anatomy. Carotid stenosis measurements (when applicable) are obtained utilizing  NASCET criteria, using the distal internal carotid diameter as the denominator. Multiphase CT imaging of the brain was performed following IV bolus contrast injection. Subsequent parametric perfusion maps were calculated using RAPID software. CONTRAST:  157mL OMNIPAQUE IOHEXOL 350 MG/ML SOLN COMPARISON:  None. FINDINGS: CT HEAD FINDINGS Brain: No acute CT finding. Chronic small-vessel ischemic changes of the cerebral hemispheric white matter. 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 No sign of acute infarction, mass lesion, hemorrhage, hydrocephalus or extra-axial collection. Vascular: There is atherosclerotic calcification of the major vessels at the base of the brain. Skull: Negative Sinuses/Orbits: Clear/normal Other: None ASPECTS ([place] Stroke Program Early CT Score) - Ganglionic level infarction (caudate, lentiform nuclei, internal capsule, insula, M1-M3 cortex): 7 -  Supraganglionic infarction (M4-M6 cortex): 3 Total score (0-10 with 10 being normal): 10 Review of the MIP images confirms the above findings CTA NECK FINDINGS Aortic arch: Normal Right carotid system: Common carotid artery widely patent to the bifurcation. Mild calcified plaque at the carotid bifurcation and ICA bulb but no stenosis. Cervical ICA widely patent. Left carotid system: Common carotid artery widely patent to the bifurcation. Calcified plaque at the ICA bulb. No stenosis. Vertebral arteries: Both vertebral artery origins are widely patent. The right vertebral artery is dominant. Both vertebral arteries appear normal through the cervical region to the foramen magnum. Skeleton: Ordinary cervical spondylosis. Other neck: No soft tissue lesion. Upper chest: Normal Review of the MIP images confirms the above findings CTA HEAD FINDINGS Anterior circulation: Both internal carotid arteries are widely patent through the skull base and siphon regions. Ordinary siphon atherosclerotic calcification but without stenosis greater than 30%. The anterior and middle cerebral vessels are patent without large or medium vessel occlusion, proximal stenosis, aneurysm or vascular malformation. Posterior circulation: Both vertebral arteries are patent through the foramen magnum. The left terminates in PICA. The right vertebral artery supplies the basilar. No basilar stenosis. Posterior circulation branch vessels are patent. Venous sinuses: Patent and normal. Anatomic variants: None significant. Review of the MIP images confirms the above findings CT Brain Perfusion Findings: ASPECTS: 10 CBF (<30%) Volume: 7mL Perfusion (Tmax>6.0s) volume: 68mL Mismatch Volume: 46mL Infarction Location:None IMPRESSION: 1. No acute finding. Chronic small-vessel ischemic changes of the white matter. 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 2. No large or medium vessel occlusion. 3. Mild nonstenotic atherosclerotic disease at  both carotid bifurcations. 4. Normal perfusion study. 5. These results were called by telephone at the time of interpretation on 11/09/2020 at 3:07 pm to provider [facility] , who verbally acknowledged these results. Electronically Signed   By: Nelson Chimes M.D.   On: 11/09/2020 15:08    Procedures .Critical Care Performed by: Suzy Bouchard, PA-C Authorized by: Suzy Bouchard, PA-C   Critical care provider statement:    Critical care time (minutes):  45   Critical care was necessary to treat or prevent imminent or life-threatening deterioration of the following conditions:  CNS failure or compromise   Critical care was time spent personally by me on the following activities:  Discussions with consultants, evaluation of patient's response to treatment, examination of patient, ordering and performing treatments and interventions, ordering and review of laboratory studies, ordering and review of radiographic studies, pulse oximetry, re-evaluation of patient's condition, obtaining history from patient or surrogate and review of old charts   (including critical care time)  Medications Ordered in ED Medications  sodium chloride (PF) 0.9 % injection (  Contrast Given 11/09/20 1423)  iohexol (OMNIPAQUE)  350 MG/ML injection 100 mL (100 mLs Intravenous Contrast Given 11/09/20 1433)    ED Course  I have reviewed the triage vital signs and the nursing notes.  Pertinent labs & imaging results that were available during my care of the patient were reviewed by me and considered in my medical decision making (see chart for details).  Clinical Course as of Nov 09 1534  Thu Nov 09, 2020  1350 Discussed case with Dr. Rod Mae with neurology who recommends obtaining CTA head/neck. Order placed. He recommends reconsulting if anything abnormal.   [CA]  1516 Discussed case with Dr. Neysa Bonito with [facility] who agrees to admit patient for further treatment.    [CA]    Clinical Course User Index [CA]  Suzy Bouchard, PA-C   MDM Rules/Calculators/A&P               NIH Stroke Scale: 27          69 year old male presents to the ED due to right-sided weakness, balance issues, and changes in speech. LKW 11:30PM when he went to sleep. Stable vitals. Patient in no acute distress. Physical exam significant for right-sided facial droop, right limb ataxia, right sided weakness. Patient out of the code stroke window. CT head and stroke labs ordered. Initial evaluation performed with Dr. Jeanell Sparrow who agrees with assessment and plan. NIH 5 (partial facial droop, drift arm, drift leg, limb ataxia)   Discussed case with Dr. Rod Mae with neurology. See note above.   Significant for hyponatremia at 132, but otherwise reassuring.  CBC significant for mild leukocytosis at 14.3, but otherwise reassuring. CTA head/neck personally reviewed which demonstrates: IMPRESSION:  1. No acute finding. Chronic small-vessel ischemic changes of the  white matter. Old lacunar infarctions right thalamus and right  caudate body.  2. No large or medium vessel occlusion.  3. Mild nonstenotic atherosclerotic disease at both carotid  bifurcations.  4. Normal perfusion study.   Discussed case with Dr. Neysa Bonito with [facility] who agrees to admit patient for further work-up. COVID test ordered. MRI brain ordered.   Final Clinical Impression(s) / ED Diagnoses Final diagnoses:  Unilateral weakness  Facial droop    Rx / DC Orders ED Discharge Orders    None       Karie Kirks 11/09/20 1535    Pattricia Boss, MD 11/13/20 1330

## 2020-11-09 NOTE — Assessment & Plan Note (Signed)
Has been mild to mod elevated to date, pt hesitant to take bp med, encourage to take bp at home daily for 10 days and call with average

## 2020-11-09 NOTE — Progress Notes (Signed)
Report called to [place], Laurel Dimmer, RN

## 2020-11-09 NOTE — ED Notes (Signed)
Patient transported to CT 

## 2020-11-09 NOTE — ED Triage Notes (Signed)
Patient reports yesterday he went to PCP and got a flu and pneumonia shot from his PCP as well as a cortisone shot in his left hip for spinal stenosis. Patient reports he woke up this morning and felt like his speech was different and he had another fall.

## 2020-11-09 NOTE — H&P (Signed)
History and Physical        Hospital Admission Note Date: 11/09/2020  Patient name: Gerald Jenkins Medical record number: [id number] Date of birth: 1951/04/18 Age: 69 y.o. Gender: male  PCP: Biagio Borg, MD  Patient coming from: Home Lives with: Wife At baseline, ambulates: Independently  Chief Complaint    Chief Complaint  Patient presents with  . Weakness  . Fall      HPI:   This is a 69 year old male with past medical history of melanoma, anxiety and hyperlipidemia who presented to the ED due to balance issues and multiple fall between yesterday and today.  Patient had an unwitnessed fall to his right side last night around 11:15 PM while he was cleaning his house but otherwise felt normal and did not think much of it.  He did not hit his head or lose consciousness.  Last known normal around 11:30 PM last night. Went to bed soon after that and woke up around 9 AM.  At that time he felt off balance and was leaning towards the right side with associated speech changes and facial droop.  Patient's wife at bedside states that he fell 2 more times today both towards his right side and even was walking into walls on his right side.  Patient denies any vision changes different from his baseline.  States that his symptoms have all nearly resolved by now and feels nearly back to baseline.  Patient admits to smoking half pack of cigarettes a day and drinks 3-4 beers nightly, 5-6 nights a week.  Last alcoholic drink was yesterday prior to his fall.  No known history of MI, atrial fibrillation or other arrhythmia or cardiovascular issue or stroke.  No history of seizures.  Of note, patient states that he stopped taking his statin about 6 months ago as he thought it may have been causing him pain though his pain persisted and believes that the statin was not because of the symptoms and was  planning to restart his statin   ED Course: Afebrile, hypertensive, on room air. Notable Labs: Na 132-> 134, K3.6, chloride 96, BUN 19, creatinine 1.12, alcohol level negative. 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  Noted to be out of the stroke window and was not given TPA.  Dr. Irish Elders, neurology, was contacted by the ED PA who recommended MRI.     Vitals:   11/09/20 1530 11/09/20 1606  BP: (!) 167/100 (!) 177/98  Pulse: 78 82  Resp: 18 20  Temp:  97.8 F (36.6 C)  SpO2: 99% 100%     Review of Systems:  Review of Systems  All other systems reviewed and are negative.   Medical/Social/Family History   Past Medical History: Past Medical History:  Diagnosis Date  . ANXIETY 01/19/2008  . Cervicalgia 01/19/2008  . DISORDER OF BONE AND CARTILAGE UNSPECIFIED 01/11/2008  . HYPERLIPIDEMIA 01/19/2008  . Lumbar spinal stenosis 11/08/2020  . Melanoma ([place]) 09/09/2015  . 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 PSA, INCREASED 07/12/2010  . RASH-NONVESICULAR  01/19/2008    Past Surgical History:  Procedure Laterality Date  . CATARACT EXTRACTION    . lumbar disease      Medications: Prior to Admission medications   Medication Sig Start Date End Date Taking? Authorizing Provider  acetaminophen (TYLENOL) 500 MG tablet Take 1,000 mg by mouth every 6 (six) hours as needed for mild pain.   Yes [provider]  pregabalin (LYRICA) 75 MG capsule Take 75 mg by mouth 2 (two) times daily.    Yes [provider]  rosuvastatin (CRESTOR) 40 MG tablet Take 0.5 tablets (20 mg total) by mouth daily. 11/08/20  Yes Biagio Borg, MD  sildenafil (VIAGRA) 100 MG tablet TAKE 1 TABLET BY MOUTH EVERY OTHER DAY AS NEEDED Patient taking differently: Take 100 mg by mouth as needed for erectile  dysfunction.  11/08/20  Yes Biagio Borg, MD  Influenza vac split quadrivalent PF (FLUZONE HIGH-DOSE) 0.5 ML injection Fluzone High-Dose 2018-2019 (PF) 180 mcg/0.5 mL intramuscular syringe  ADM 0.5ML IM UTD    [provider]    Allergies:   Allergies  Allergen Reactions  . Penicillins Other (See Comments)    Childhood allergy    Social History:  reports that he has been smoking. He has never used smokeless tobacco. He reports current alcohol use of about 10.0 standard drinks of alcohol per week. No history on file for drug use.  Family History: Family History  Problem Relation Age of Onset  . Lumbar disc disease Father   . Multiple sclerosis Other   . Diabetes Other   . Cancer Other        lung cancer     Objective   Physical Exam: Blood pressure (!) 177/98, pulse 82, temperature 97.8 F (36.6 C), temperature source Oral, resp. rate 20, SpO2 100 %.  Physical Exam Vitals and nursing note reviewed.  Constitutional:      Appearance: Normal appearance.  HENT:     Head: Normocephalic and atraumatic.  Eyes:     Conjunctiva/sclera: Conjunctivae normal.  Cardiovascular:     Rate and Rhythm: Normal rate and regular rhythm.     Heart sounds: No murmur heard.   Pulmonary:     Effort: Pulmonary effort is normal.     Breath sounds: Normal breath sounds.  Abdominal:     General: Abdomen is flat.     Palpations: Abdomen is soft.  Musculoskeletal:        General: No swelling or tenderness.  Skin:    Coloration: Skin is not jaundiced or pale.  Neurological:     General: No focal deficit present.     Mental Status: He is alert. Mental status is at baseline.     Sensory: No sensory deficit.     Coordination: Coordination normal.     Comments: Possibly 4/5 muscle strength of RLE compared to 5/5 muscle strength of LLE  Psychiatric:        Mood and Affect: Mood normal.        Behavior: Behavior normal.     LABS on Admission: I have personally reviewed all the  labs and imaging below    Basic Metabolic Panel: Recent Labs  Lab 11/06/20 0750 11/06/20 0750 11/09/20 1306 11/09/20 1316  NA 135   < > 132* 134*  K 4.5   < > 3.6 4.0  CL 98   < > 96* 96*  CO2 29  --  24  --   GLUCOSE 78   < > 106*  109*  BUN 16   < > 19 25*  CREATININE 1.16   < > 1.12 1.00  CALCIUM 9.7  --  9.7  --    < > = values in this interval not displayed.   Liver Function Tests: Recent Labs  Lab 11/06/20 0750 11/09/20 1306  AST 14 24  ALT 12 18  ALKPHOS 40 42  BILITOT 0.5 0.4  PROT 7.3 8.4*  ALBUMIN 4.6 5.0   No results for input(s): LIPASE, AMYLASE in the last 168 hours. No results for input(s): AMMONIA in the last 168 hours. CBC: Recent Labs  Lab 11/06/20 0750 11/06/20 0750 11/09/20 1316 11/09/20 1351  WBC 9.1  --   --  14.3*  NEUTROABS 5.5   < >  --  11.1*  HGB 14.9   < > 15.6 14.2  HCT 43.7   < > 46.0 40.8  MCV 90.2   < >  --  90.3  PLT 340.0  --   --  286   < > = values in this interval not displayed.   Cardiac Enzymes: No results for input(s): CKTOTAL, CKMB, CKMBINDEX, TROPONINI in the last 168 hours. BNP: Invalid input(s): POCBNP CBG: No results for input(s): GLUCAP in the last 168 hours.  Radiological Exams on Admission:  CT Angio Head W or Wo Contrast  Result Date: 11/09/2020 CLINICAL DATA:  Patient fell yesterday. Awoke today with right-sided weakness, right-sided facial droop and slurred speech. The folks going on her arm and EXAM: CT ANGIOGRAPHY HEAD AND NECK CT PERFUSION BRAIN TECHNIQUE: Multidetector CT imaging of the head and neck was performed using the standard protocol during bolus administration of intravenous contrast. Multiplanar CT image reconstructions and MIPs were obtained to evaluate the vascular anatomy. Carotid stenosis measurements (when applicable) are obtained utilizing NASCET criteria, using the distal internal carotid diameter as the denominator. Multiphase CT imaging of the brain was performed following IV bolus  contrast injection. Subsequent parametric perfusion maps were calculated using RAPID software. CONTRAST:  131mL OMNIPAQUE IOHEXOL 350 MG/ML SOLN COMPARISON:  None. FINDINGS: CT HEAD FINDINGS Brain: No acute CT finding. Chronic small-vessel ischemic changes of the cerebral hemispheric white matter. 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 No sign of acute infarction, mass lesion, hemorrhage, hydrocephalus or extra-axial collection. Vascular: There is atherosclerotic calcification of the major vessels at the base of the brain. Skull: Negative Sinuses/Orbits: Clear/normal Other: None ASPECTS ([place] Stroke Program Early CT Score) - Ganglionic level infarction (caudate, lentiform nuclei, internal capsule, insula, M1-M3 cortex): 7 - Supraganglionic infarction (M4-M6 cortex): 3 Total score (0-10 with 10 being normal): 10 Review of the MIP images confirms the above findings CTA NECK FINDINGS Aortic arch: Normal Right carotid system: Common carotid artery widely patent to the bifurcation. Mild calcified plaque at the carotid bifurcation and ICA bulb but no stenosis. Cervical ICA widely patent. Left carotid system: Common carotid artery widely patent to the bifurcation. Calcified plaque at the ICA bulb. No stenosis. Vertebral arteries: Both vertebral artery origins are widely patent. The right vertebral artery is dominant. Both vertebral arteries appear normal through the cervical region to the foramen magnum. Skeleton: Ordinary cervical spondylosis. Other neck: No soft tissue lesion. Upper chest: Normal Review of the MIP images confirms the above findings CTA HEAD FINDINGS Anterior circulation: Both internal carotid arteries are widely patent through the skull base and siphon regions. Ordinary siphon atherosclerotic calcification but without stenosis greater than 30%. The anterior and middle cerebral vessels are patent without large or  medium vessel occlusion, proximal stenosis, aneurysm or vascular  malformation. Posterior circulation: Both vertebral arteries are patent through the foramen magnum. The left terminates in PICA. The right vertebral artery supplies the basilar. No basilar stenosis. Posterior circulation branch vessels are patent. Venous sinuses: Patent and normal. Anatomic variants: None significant. Review of the MIP images confirms the above findings CT Brain Perfusion Findings: ASPECTS: 10 CBF (<30%) Volume: 41mL Perfusion (Tmax>6.0s) volume: 73mL Mismatch Volume: 60mL Infarction Location:None IMPRESSION: 1. No acute finding. Chronic small-vessel ischemic changes of the white matter. 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 2. No large or medium vessel occlusion. 3. Mild nonstenotic atherosclerotic disease at both carotid bifurcations. 4. Normal perfusion study. 5. These results were called by telephone at the time of interpretation on 11/09/2020 at 3:07 pm to provider [facility] , who verbally acknowledged these results. Electronically Signed   By: Nelson Chimes M.D.   On: 11/09/2020 15:08   CT ANGIO NECK W OR WO CONTRAST  Result Date: 11/09/2020 CLINICAL DATA:  Patient fell yesterday. Awoke today with right-sided weakness, right-sided facial droop and slurred speech. The folks going on her arm and EXAM: CT ANGIOGRAPHY HEAD AND NECK CT PERFUSION BRAIN TECHNIQUE: Multidetector CT imaging of the head and neck was performed using the standard protocol during bolus administration of intravenous contrast. Multiplanar CT image reconstructions and MIPs were obtained to evaluate the vascular anatomy. Carotid stenosis measurements (when applicable) are obtained utilizing NASCET criteria, using the distal internal carotid diameter as the denominator. Multiphase CT imaging of the brain was performed following IV bolus contrast injection. Subsequent parametric perfusion maps were calculated using RAPID software. CONTRAST:  176mL OMNIPAQUE IOHEXOL 350 MG/ML SOLN COMPARISON:   None. FINDINGS: CT HEAD FINDINGS Brain: No acute CT finding. Chronic small-vessel ischemic changes of the cerebral hemispheric white matter. 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 No sign of acute infarction, mass lesion, hemorrhage, hydrocephalus or extra-axial collection. Vascular: There is atherosclerotic calcification of the major vessels at the base of the brain. Skull: Negative Sinuses/Orbits: Clear/normal Other: None ASPECTS ([place] Stroke Program Early CT Score) - Ganglionic level infarction (caudate, lentiform nuclei, internal capsule, insula, M1-M3 cortex): 7 - Supraganglionic infarction (M4-M6 cortex): 3 Total score (0-10 with 10 being normal): 10 Review of the MIP images confirms the above findings CTA NECK FINDINGS Aortic arch: Normal Right carotid system: Common carotid artery widely patent to the bifurcation. Mild calcified plaque at the carotid bifurcation and ICA bulb but no stenosis. Cervical ICA widely patent. Left carotid system: Common carotid artery widely patent to the bifurcation. Calcified plaque at the ICA bulb. No stenosis. Vertebral arteries: Both vertebral artery origins are widely patent. The right vertebral artery is dominant. Both vertebral arteries appear normal through the cervical region to the foramen magnum. Skeleton: Ordinary cervical spondylosis. Other neck: No soft tissue lesion. Upper chest: Normal Review of the MIP images confirms the above findings CTA HEAD FINDINGS Anterior circulation: Both internal carotid arteries are widely patent through the skull base and siphon regions. Ordinary siphon atherosclerotic calcification but without stenosis greater than 30%. The anterior and middle cerebral vessels are patent without large or medium vessel occlusion, proximal stenosis, aneurysm or vascular malformation. Posterior circulation: Both vertebral arteries are patent through the foramen magnum. The left terminates in PICA. The right vertebral artery  supplies the basilar. No basilar stenosis. Posterior circulation branch vessels are patent. Venous sinuses: Patent and normal. Anatomic variants: None significant. Review of the MIP images confirms the  above findings CT Brain Perfusion Findings: ASPECTS: 10 CBF (<30%) Volume: 61mL Perfusion (Tmax>6.0s) volume: 37mL Mismatch Volume: 72mL Infarction Location:None IMPRESSION: 1. No acute finding. Chronic small-vessel ischemic changes of the white matter. 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 2. No large or medium vessel occlusion. 3. Mild nonstenotic atherosclerotic disease at both carotid bifurcations. 4. Normal perfusion study. 5. These results were called by telephone at the time of interpretation on 11/09/2020 at 3:07 pm to provider [facility] , who verbally acknowledged these results. Electronically Signed   By: Nelson Chimes M.D.   On: 11/09/2020 15:08   CT CEREBRAL PERFUSION W CONTRAST  Result Date: 11/09/2020 CLINICAL DATA:  Patient fell yesterday. Awoke today with right-sided weakness, right-sided facial droop and slurred speech. The folks going on her arm and EXAM: CT ANGIOGRAPHY HEAD AND NECK CT PERFUSION BRAIN TECHNIQUE: Multidetector CT imaging of the head and neck was performed using the standard protocol during bolus administration of intravenous contrast. Multiplanar CT image reconstructions and MIPs were obtained to evaluate the vascular anatomy. Carotid stenosis measurements (when applicable) are obtained utilizing NASCET criteria, using the distal internal carotid diameter as the denominator. Multiphase CT imaging of the brain was performed following IV bolus contrast injection. Subsequent parametric perfusion maps were calculated using RAPID software. CONTRAST:  155mL OMNIPAQUE IOHEXOL 350 MG/ML SOLN COMPARISON:  None. FINDINGS: CT HEAD FINDINGS Brain: No acute CT finding. Chronic small-vessel ischemic changes of the cerebral hemispheric white matter. Old lacunar  infarction right thalamus and right caudate body. No sign of acute infarction, mass lesion, hemorrhage, hydrocephalus or extra-axial collection. Vascular: There is atherosclerotic calcification of the major vessels at the base of the brain. Skull: Negative Sinuses/Orbits: Clear/normal Other: None ASPECTS ([place] Stroke Program Early CT Score) - Ganglionic level infarction (caudate, lentiform nuclei, internal capsule, insula, M1-M3 cortex): 7 - Supraganglionic infarction (M4-M6 cortex): 3 Total score (0-10 with 10 being normal): 10 Review of the MIP images confirms the above findings CTA NECK FINDINGS Aortic arch: Normal Right carotid system: Common carotid artery widely patent to the bifurcation. Mild calcified plaque at the carotid bifurcation and ICA bulb but no stenosis. Cervical ICA widely patent. Left carotid system: Common carotid artery widely patent to the bifurcation. Calcified plaque at the ICA bulb. No stenosis. Vertebral arteries: Both vertebral artery origins are widely patent. The right vertebral artery is dominant. Both vertebral arteries appear normal through the cervical region to the foramen magnum. Skeleton: Ordinary cervical spondylosis. Other neck: No soft tissue lesion. Upper chest: Normal Review of the MIP images confirms the above findings CTA HEAD FINDINGS Anterior circulation: Both internal carotid arteries are widely patent through the skull base and siphon regions. Ordinary siphon atherosclerotic calcification but without stenosis greater than 30%. The anterior and middle cerebral vessels are patent without large or medium vessel occlusion, proximal stenosis, aneurysm or vascular malformation. Posterior circulation: Both vertebral arteries are patent through the foramen magnum. The left terminates in PICA. The right vertebral artery supplies the basilar. No basilar stenosis. Posterior circulation branch vessels are patent. Venous sinuses: Patent and normal. Anatomic variants: None  significant. Review of the MIP images confirms the above findings CT Brain Perfusion Findings: ASPECTS: 10 CBF (<30%) Volume: 51mL Perfusion (Tmax>6.0s) volume: 51mL Mismatch Volume: 48mL Infarction Location:None IMPRESSION: 1. No acute finding. Chronic small-vessel ischemic changes of the white matter. 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 2. No large or medium vessel occlusion. 3. Mild nonstenotic atherosclerotic disease at both carotid bifurcations. 4.  Normal perfusion study. 5. These results were called by telephone at the time of interpretation on 11/09/2020 at 3:07 pm to provider [facility] , who verbally acknowledged these results. Electronically Signed   By: Nelson Chimes M.D.   On: 11/09/2020 15:08      EKG: Independently reviewed   A & P   Principal Problem:   Acute right-sided weakness Active Problems:   HLD (hyperlipidemia)   Hyponatremia   Alcohol abuse   Tobacco abuse   1. Acute right sided weakness, concern for TIA VS.  CVA a. Acute onset right-sided weakness with slurred speech and recurrent falls to right side b. 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  Noted to be out of the stroke window and was not given TPA.  c. Neurology recommended MRI and admission to [facility] versus [facility] pending findings d. 11/06/2020 lipid panel: LDL 183, total cholesterol 275 e. Restart rosuvastatin at increased to 40 mg daily f. Follow-up MRI g. Echo h. PT eval i. Telemetry j. HbA1c k. Aspirin l. Advised tobacco cessation  2. Tobacco use a. Recommended tobacco cessation  3. Alcohol abuse a. Recommended cessation b. CIWA protocol  4. Hyponatremia a. IV fluids  5. Hyperlipidemia a. As above    DVT prophylaxis: Lovenox   Code Status: Not on file  Diet: Heart  healthy Family Communication: Admission, patients condition and plan of care including tests being ordered have been discussed with the patient who indicates understanding and agrees with the plan and Code Status. Patient's wife was updated  Disposition Plan: The appropriate patient status for this patient is OBSERVATION. Observation status is judged to be reasonable and necessary in order to provide the required intensity of service to ensure the patient's safety. The patient's presenting symptoms, physical exam findings, and initial radiographic and laboratory data in the context of their medical condition is felt to place them at decreased risk for further clinical deterioration. Furthermore, it is anticipated that the patient will be medically stable for discharge from the hospital within 2 midnights of admission. The following factors support the patient status of observation.   " The patient's presenting symptoms include fall, right-sided weakness. " The physical exam findings relatively unremarkable. " The initial radiographic and laboratory data are pending.   Consultants  . Neurology  Procedures  . None  Time Spent on Admission: 60 minutes    Harold Hedge, DO [facility]  11/09/2020, 4:08 PM

## 2020-11-09 NOTE — Assessment & Plan Note (Signed)
stable overall by history and exam, recent data reviewed with pt, and pt to continue medical treatment as before,  to f/u any worsening symptoms or concerns  

## 2020-11-09 NOTE — Assessment & Plan Note (Signed)

## 2020-11-09 NOTE — Assessment & Plan Note (Addendum)
Pt agrees to restart statin, follow lower chol diet, also for ct cardiac scoring

## 2020-11-09 NOTE — ED Provider Notes (Signed)
    Pattricia Boss, MD 11/10/20 [phone]

## 2020-11-09 NOTE — ED Notes (Signed)
triage nurse, MD and PA in room.

## 2020-11-09 NOTE — Assessment & Plan Note (Addendum)
Chronic stable, stable overall by history and exam, recent data reviewed with pt, and pt to continue medical treatment as before,  to f/u any worsening symptoms or concerns  I spent 31 minutes in addition to time for CPX wellness examination in preparing to see the patient by review of recent labs, imaging and procedures, obtaining and reviewing separately obtained history, communicating with the patient and family or caregiver, ordering medications, tests or procedures, and documenting clinical information in the EHR including the differential Dx, treatment, and any further evaluation and other management of low back pain, elev bp, hld, increased psa,

## 2020-11-09 NOTE — ED Notes (Signed)
Called Ty Hilts, MRI Tech for this pt.

## 2020-11-09 NOTE — ED Notes (Signed)
Patient describing potential stroke like symptoms and said he originally felt normal. RN immediately went to MD office for doctor to take a look at patient as he reports he woke up around 0900 and felt normal. Patient then reports to doctor that his technical last seen normal would have been last night around 2330 when he went to bed as he felt his speech was different when he woke up and that his voice felt "weak".

## 2020-11-09 NOTE — Plan of Care (Signed)
  Problem: Coping: Goal: Level of anxiety will decrease Outcome: Progressing   Problem: Pain Managment: Goal: General experience of comfort will improve Outcome: Progressing   Problem: Education: Goal: Knowledge of disease or condition will improve Outcome: Progressing   Problem: Education: Goal: Knowledge of General Education information will improve Description: Including pain rating scale, medication(s)/side effects and non-pharmacologic comfort measures Outcome: Completed/Met   Problem: Activity: Goal: Risk for activity intolerance will decrease Outcome: Completed/Met

## 2020-11-10 ENCOUNTER — Observation Stay (HOSPITAL_BASED_OUTPATIENT_CLINIC_OR_DEPARTMENT_OTHER): Payer: BC Managed Care – PPO

## 2020-11-10 DIAGNOSIS — I6389 Other cerebral infarction: Secondary | ICD-10-CM | POA: Diagnosis not present

## 2020-11-10 DIAGNOSIS — F1010 Alcohol abuse, uncomplicated: Secondary | ICD-10-CM | POA: Diagnosis not present

## 2020-11-10 DIAGNOSIS — I6330 Cerebral infarction due to thrombosis of unspecified cerebral artery: Secondary | ICD-10-CM | POA: Insufficient documentation

## 2020-11-10 DIAGNOSIS — R531 Weakness: Secondary | ICD-10-CM

## 2020-11-10 LAB — ECHOCARDIOGRAM COMPLETE
Area-P 1/2: 4.41 cm2
Height: 73 in
S' Lateral: 2.3 cm
Weight: 2766.16 oz

## 2020-11-10 MED ORDER — AMLODIPINE BESYLATE 5 MG PO TABS: 5.0000 mg | ORAL_TABLET | Freq: Every day | ORAL | 11 refills | Status: DC

## 2020-11-10 MED ORDER — ASPIRIN 81 MG PO TBEC: 81.0000 mg | DELAYED_RELEASE_TABLET | Freq: Every day | ORAL | 11 refills | Status: AC

## 2020-11-10 MED ORDER — CLOPIDOGREL BISULFATE 75 MG PO TABS
75.0000 mg | ORAL_TABLET | Freq: Every day | ORAL | 0 refills | Status: DC
Start: 2020-11-11 — End: 2021-01-23

## 2020-11-10 MED ORDER — ROSUVASTATIN CALCIUM 40 MG PO TABS
20.0000 mg | ORAL_TABLET | Freq: Every day | ORAL | 11 refills | Status: DC
Start: 2020-11-10 — End: 2021-11-14

## 2020-11-10 MED ORDER — CLOPIDOGREL BISULFATE 75 MG PO TABS
75.0000 mg | ORAL_TABLET | Freq: Every day | ORAL | Status: DC
  Administered 2020-11-10: 75 mg via ORAL
  Filled 2020-11-10: qty 1

## 2020-11-10 NOTE — Evaluation (Addendum)
Physical Therapy Evaluation Patient Details Name: Gerald Jenkins MRN: [id number] DOB: 01-14-1951 Today's Date: 11/10/2020   History of Present Illness  69 y.o. male admitted on 11/09/20 for R sided weakness, slurred speech, gait instability.  MRI scan of the brain was obtained which showed left subcortical weakly diffusion positive lesion compatible with an infarct.  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  Pt with significant PMH of lumbar spinal stenosis, cervicalgia, cataract extraction, right eye macular degeneration, left eye optic neuropathy.  Pt reports having had cortizone shots for back on 11/08/20.   Clinical Impression  Pt with occasional stumbles, but between his vision deficits and his low back issues, this may be his normal.  Strength, sensation and coordination were all normal.  He had some difficulty remembering BEFAST despite multiple providers reviewing it with him this AM.  Significant other present for all education and supportive.   PT to follow acutely for deficits listed below.  I also reviewed 2 low back stretches with him for before he gets up in the morning to help with stiffness and pain.     Follow Up Recommendations No PT follow up    Equipment Recommendations  None recommended by PT    Recommendations for Other Services       Precautions / Restrictions Precautions Precautions: Fall Precaution Comments: pt reports h/o falls/stumbles due to his vision.  Restrictions Weight Bearing Restrictions: No      Mobility  Bed Mobility Overal bed mobility: Independent                  Transfers Overall transfer level: Independent Equipment used: None                Ambulation/Gait Ambulation/Gait assistance: Supervision Gait Distance (Feet): 300 Feet Assistive device: None Gait Pattern/deviations: WFL(Within Functional Limits)   Gait velocity interpretation: >4.37 ft/sec, indicative of  normal walking speed General Gait Details: Pt with very fast gait speed, at times gets very close to obstacles in the hallway and admitts that he has stumbled before because of his baseline vision deficits.    Stairs            Wheelchair Mobility    Modified Rankin (Stroke Patients Only) Modified Rankin (Stroke Patients Only) Pre-Morbid Rankin Score: No symptoms Modified Rankin: No significant disability     Balance Overall balance assessment: Needs assistance   Sitting balance-Leahy Scale: Good       Standing balance-Leahy Scale: Good                   Standardized Balance Assessment Standardized Balance Assessment : Dynamic Gait Index   Dynamic Gait Index Level Surface: Normal Change in Gait Speed: Normal Gait with Horizontal Head Turns: Mild Impairment Gait with Vertical Head Turns: Normal Gait and Pivot Turn: Mild Impairment Step Over Obstacle: Normal       Pertinent Vitals/Pain Pain Assessment: Faces Faces Pain Scale: Hurts even more Pain Location: chronic low back  Pain Descriptors / Indicators: Grimacing;Guarding Pain Intervention(s): Limited activity within patient's tolerance;Monitored during session;Repositioned    Home Living Family/patient expects to be discharged to:: Private residence Living Arrangements: Spouse/significant other Available Help at Discharge: Family;Available PRN/intermittently Type of Home: House Home Access: Stairs to enter Entrance Stairs-Rails: None Entrance Stairs-Number of Steps: 4 Home Layout: Two level;Bed/bath upstairs Home Equipment: None 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    Prior Function Level of Independence: Independent  Comments: 7 mo h/o low back issues, tried OP PT (wife reports he never did the exercises).     Hand Dominance   Dominant Hand: Left    Extremity/Trunk Assessment   Upper Extremity Assessment Upper Extremity Assessment: Defer to OT evaluation     Lower Extremity Assessment Lower Extremity Assessment: Overall WFL for tasks assessed    Cervical / Trunk Assessment Cervical / Trunk Assessment: Other exceptions Cervical / Trunk Exceptions: 7 mo h/o low back pain and stiffness, at times radiates down back of L leg, but not past knee, has had OP PT for it without much success and this week had cortizone injections.  Communication   Communication: No difficulties  Cognition Arousal/Alertness: Awake/alert Behavior During Therapy: WFL for tasks assessed/performed Overall Cognitive Status: Impaired/Different from baseline Area of Impairment: Memory                     Memory: Decreased recall of precautions;Decreased short-term memory         General Comments: Decreased recall of BEFAST even after multiple practitioners asked him.       General Comments General comments (skin integrity, edema, etc.): Reviewed BEFAST with pt and significant other.  Pt initially could not recall any of the letters (and it has been reviewed with him multiple times this AM).      Exercises     Assessment/Plan    PT Assessment Patient needs continued PT services  PT Problem List Decreased balance;Decreased cognition       PT Treatment Interventions DME instruction;Gait training;Stair training;Functional mobility training;Therapeutic exercise;Therapeutic activities;Balance training;Neuromuscular re-education;Cognitive remediation;Patient/family education    PT Goals (Current goals can be found in the Care Plan section)  Acute Rehab PT Goals Patient Stated Goal: to go home today PT Goal Formulation: With patient/family Time For Goal Achievement: 11/24/20 Potential to Achieve Goals: Good    Frequency Min 4X/week   Barriers to discharge        Co-evaluation               AM-PAC PT "6 Clicks" Mobility  Outcome Measure Help needed turning from your back to your side while in a flat bed without using bedrails?: None Help  needed moving from lying on your back to sitting on the side of a flat bed without using bedrails?: None Help needed moving to and from a bed to a chair (including a wheelchair)?: None Help needed standing up from a chair using your arms (e.g., wheelchair or bedside chair)?: None Help needed to walk in hospital room?: None Help needed climbing 3-5 steps with a railing? : None 6 Click Score: 24    End of Session   Activity Tolerance: Patient tolerated treatment well Patient left: in bed;with family/visitor present   PT Visit Diagnosis: Difficulty in walking, not elsewhere classified (R26.2)    Time: 0240-9735 PT Time Calculation (min) (ACUTE ONLY): 43 min   Charges:   PT Evaluation $PT Eval Moderate Complexity: 1 Mod PT Treatments $Gait Training: 8-22 mins $Self Care/Home Management: 8-22       Verdene Lennert, PT, DPT  Acute Rehabilitation [phone] pager [phone]) [phone] office

## 2020-11-10 NOTE — Discharge Instructions (Signed)
Eating Plan After Stroke A stroke causes damage to the brain cells, which can affect your ability to walk, talk, and even eat. The impact of a stroke is different for everyone, and so is recovery. A good nutrition plan is important for your recovery. It can also lower your risk of another stroke. If you have difficulty chewing and swallowing your food, a dietitian or your stroke care team can help so that you can enjoy eating healthy foods. What are tips for following this plan?  Reading food labels  Choose foods that have less than 300 milligrams (mg) of sodium per serving. Limit your sodium intake to less than 1,500 mg per day.  Avoid foods that have saturated fat and trans fat.  Choose foods that are low in cholesterol. Limit the amount of cholesterol you eat each day to less than 200 mg.  Choose foods that are high in fiber. Eat 20-30 grams (g) of fiber each day.  Avoid foods with added sugar. Check the food label for ingredients such as sugar, corn syrup, honey, fructose, molasses, and cane juice. Shopping  At the grocery store, buy most of your food from areas near the walls of the store. This includes: ? Fresh fruits and vegetables. ? Dry grains, beans, nuts, and seeds. ? Fresh seafood, poultry, lean meats, and eggs. ? Low-fat dairy products.  Buy whole ingredients instead of prepackaged foods.  Buy fresh, in-season fruits and vegetables from local farmers markets.  Buy frozen fruits and vegetables in resealable bags. Cooking  Prepare foods with very little salt. Use herbs or salt-free spices instead.  Cook with heart-healthy oils, such as olive, avocado, canola, soybean, or sunflower oil.  Avoid frying foods. Bake, grill, or broil foods instead.  Remove visible fat and skin from meat and poultry before eating.  Modify food textures as told by your health care provider. Meal planning  Eat a wide variety of colorful fruits and vegetables. Make sure one-half of your  plate is filled with fruits and vegetables at each meal.  Eat fruits and vegetables that are high in potassium, such as: ? Apples, bananas, oranges, and melon. ? Sweet potatoes, spinach, zucchini, and tomatoes.  Eat fish that contain heart-healthy fats (omega-3 fats) at least twice a week. These include salmon, tuna, mackerel, and sardines.  Eat plant foods that are high in omega-3 fats, such as flaxseeds and walnuts. Add these to cereals, yogurt, or pasta dishes.  Eat several servings of high-fiber foods each day, such as fruits, vegetables, whole grains, and beans.  Do not put salt at the table for meals.  When eating out at restaurants: ? Ask the server about low-salt or salt-free food options. ? Avoid fried foods. Look for menu items that are grilled, steamed, broiled, or roasted. ? Ask if your food can be prepared without butter. ? Ask for condiments, such as salad dressings, gravy, or sauces to be served on the side.  If you have difficulty swallowing: ? Choose foods that are softer and easier to chew and swallow. ? Cut foods into small pieces and chew well before swallowing. ? Thicken liquids as told by your health care provider or dietitian. ? Let your health care provider know if your condition does not improve over time. You may need to work with a speech therapist to re-train the muscles that are used for eating. General recommendations  Involve your family and friends in your recovery, if possible. It may be helpful to have a slower meal time   and to plan meals that include foods everyone in the family can eat.  Brush your teeth with fluoride toothpaste twice a day, and floss once a day. Keeping a clean mouth can help you swallow and can also help your appetite.  Drink enough water each day to keep your urine pale yellow. If needed, set reminders or ask your family to help you remember to drink water.  Limit alcohol intake to no more than 1 drink a day for nonpregnant  women and 2 drinks a day for men. One drink equals 12 oz of beer, 5 oz of wine, or 1 oz of hard liquor. Summary  Following this eating plan can help in your stroke recovery and can decrease your risk for another stroke.  Let your health care provider know if you have problems with swallowing. You may need to work with a speech therapist. This information is not intended to replace advice given to you by your health care provider. Make sure you discuss any questions you have with your health care provider. Document Revised: 03/25/2019 Document Reviewed: 02/09/2018 Elsevier Patient Education  2020 [employer]  

## 2020-11-10 NOTE — Progress Notes (Signed)
SLP Cancellation Note  Patient Details Name: Gerald Jenkins MRN: [id number] DOB: [birth year]   Cancelled treatment:       Reason Eval/Treat Not Completed: SLP screened, no needs identified, will sign off. Pt reported that his motor speech impairment has resolved. He denied any acute changes in language or cognition and none were observed by occupational therapy. Formal speech/language/cognition evaluation does not appear to be warranted at this time. SLP will sign off.   Bali Lyn I. Hardin Negus, [place], [place] Office number [phone] Pager [place] 11/10/2020, 1:15 PM

## 2020-11-10 NOTE — Plan of Care (Signed)
  Problem: Health Behavior/Discharge Planning: Goal: Ability to manage health-related needs will improve Outcome: Adequate for Discharge   Problem: Clinical Measurements: Goal: Ability to maintain clinical measurements within normal limits will improve Outcome: Adequate for Discharge Goal: Will remain free from infection Outcome: Adequate for Discharge Goal: Diagnostic test results will improve Outcome: Adequate for Discharge Goal: Respiratory complications will improve Outcome: Adequate for Discharge Goal: Cardiovascular complication will be avoided Outcome: Adequate for Discharge   Problem: Nutrition: Goal: Adequate nutrition will be maintained Outcome: Adequate for Discharge   Problem: Coping: Goal: Level of anxiety will decrease Outcome: Adequate for Discharge   Problem: Elimination: Goal: Will not experience complications related to bowel motility Outcome: Adequate for Discharge Goal: Will not experience complications related to urinary retention Outcome: Adequate for Discharge   Problem: Pain Managment: Goal: General experience of comfort will improve Outcome: Adequate for Discharge   Problem: Safety: Goal: Ability to remain free from injury will improve Outcome: Adequate for Discharge   Problem: Skin Integrity: Goal: Risk for impaired skin integrity will decrease Outcome: Adequate for Discharge   Problem: Education: Goal: Knowledge of disease or condition will improve Outcome: Adequate for Discharge Goal: Knowledge of secondary prevention will improve Outcome: Adequate for Discharge Goal: Knowledge of patient specific risk factors addressed and post discharge goals established will improve Outcome: Adequate for Discharge Goal: Individualized Educational Video(s) Outcome: Adequate for Discharge   Problem: Coping: Goal: Will verbalize positive feelings about self Outcome: Adequate for Discharge Goal: Will identify appropriate support needs Outcome:  Adequate for Discharge   Problem: Health Behavior/Discharge Planning: Goal: Ability to manage health-related needs will improve Outcome: Adequate for Discharge   Problem: Self-Care: Goal: Ability to participate in self-care as condition permits will improve Outcome: Adequate for Discharge Goal: Verbalization of feelings and concerns over difficulty with self-care will improve Outcome: Adequate for Discharge Goal: Ability to communicate needs accurately will improve Outcome: Adequate for Discharge   Problem: Nutrition: Goal: Risk of aspiration will decrease Outcome: Adequate for Discharge Goal: Dietary intake will improve Outcome: Adequate for Discharge   Problem: Intracerebral Hemorrhage Tissue Perfusion: Goal: Complications of Intracerebral Hemorrhage will be minimized Outcome: Adequate for Discharge   Problem: Ischemic Stroke/TIA Tissue Perfusion: Goal: Complications of ischemic stroke/TIA will be minimized Outcome: Adequate for Discharge   Problem: Spontaneous Subarachnoid Hemorrhage Tissue Perfusion: Goal: Complications of Spontaneous Subarachnoid Hemorrhage will be minimized Outcome: Adequate for Discharge   Problem: Acute Rehab PT Goals(only PT should resolve) Goal: Pt Will Ambulate Outcome: Adequate for Discharge Goal: Pt Will Go Up/Down Stairs Outcome: Adequate for Discharge Goal: Pt/caregiver will Perform Home Exercise Program Outcome: Adequate for Discharge Goal: PT Additional Goal #1 Outcome: Adequate for Discharge   Problem: Acute Rehab PT Goals(only PT should resolve) Goal: Pt Will Ambulate Outcome: Adequate for Discharge Goal: Pt Will Go Up/Down Stairs Outcome: Adequate for Discharge Goal: Pt/caregiver will Perform Home Exercise Program Outcome: Adequate for Discharge Goal: PT Additional Goal #1 Outcome: Adequate for Discharge

## 2020-11-10 NOTE — Evaluation (Addendum)
Occupational Therapy Evaluation and Discharge Patient Details Name: Gerald Jenkins MRN: [id number] DOB: [birth year] Today's Date: 11/10/2020    History of Present Illness 69 y.o. male admitted on 11/09/20 for R sided weakness, slurred speech, gait instability.  MRI scan of the brain was obtained which showed left subcortical weakly diffusion positive lesion compatible with an infarct.  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  Pt with significant PMH of lumbar spinal stenosis, cervicalgia, cataract extraction, right eye macular degeneration, left eye optic neuropathy   Clinical Impression   This 69 yo male admitted with above presents to acute OT back at his baseline. No further OT needs, we will sign off.     Follow Up Recommendations  No OT follow up    Equipment Recommendations  None recommended by OT       Precautions / Restrictions Precautions Precautions: Fall Restrictions Weight Bearing Restrictions: No      Mobility Bed Mobility Overal bed mobility: Independent                  Transfers Overall transfer level: Independent                    Balance Overall balance assessment: Independent                                         ADL either performed or assessed with clinical judgement   ADL Overall ADL's : Independent                                             Vision Baseline Vision/History: Macular Degeneration (left eye and optic neuropathy right eye) Patient Visual Report: No change from baseline Vision Assessment?: Yes Eye Alignment: Within Functional Limits Ocular Range of Motion: Within Functional Limits Alignment/Gaze Preference: Within Defined Limits Tracking/Visual Pursuits: Able to track stimulus in all quads without difficulty Saccades: Within functional limits Convergence: Within functional limits Visual Fields: No apparent  deficits Additional Comments: macular degeneration in L eye and uses a magnifying glass at home            Pertinent Vitals/Pain Pain Assessment: No/denies pain     Hand Dominance Left   Extremity/Trunk Assessment Upper Extremity Assessment Upper Extremity Assessment: Overall WFL for tasks assessed (mild drift with eyes shut, did drop keys with manipulation tasks--but did this with both hands and he is left handed)           Communication Communication Communication: No difficulties   Cognition Arousal/Alertness: Awake/alert Behavior During Therapy: WFL for tasks assessed/performed Overall Cognitive Status: Within Functional Limits for tasks assessed                                                Home Living Family/patient expects to be discharged to:: Private residence Living Arrangements: Spouse/significant other Available Help at Discharge: Family;Available PRN/intermittently Type of Home: House Home Access: Stairs to enter [employer] of Steps: 4 Entrance Stairs-Rails: None Home Layout: Two level;Bed/bath upstairs Alternate Level Stairs-Number of Steps: 10 Alternate Level Stairs-Rails: Right;Left (1/2 way has both rails, the other  1/2 only one rail and wall) Bathroom Shower/Tub: Occupational psychologist: Standard     Home Equipment: None   Additional Comments: Works in Science writer      Prior Functioning/Environment Level of Independence: Independent                          OT Goals(Current goals can be found in the care plan section) Acute Rehab OT Goals Patient Stated Goal: to go home today  OT Frequency:                AM-PAC OT "6 Clicks" Daily Activity     Outcome Measure Help from another person eating meals?: None Help from another person taking care of personal grooming?: None Help from another person toileting, which includes using toliet, bedpan, or urinal?: None Help from another person bathing  (including washing, rinsing, drying)?: None Help from another person to put on and taking off regular upper body clothing?: None Help from another person to put on and taking off regular lower body clothing?: None 6 Click Score: 24   End of Session Nurse Communication:  (pt needs a stroke booklet)  Activity Tolerance: Patient tolerated treatment well Patient left:  (sitting EOB)  OT Visit Diagnosis: Muscle weakness (generalized) (M62.81)                Time: 1610-9604 OT Time Calculation (min): 22 min Charges:  OT General Charges $OT Visit: 1 Visit OT Evaluation $OT Eval Moderate Complexity: 1 Mod  [place] [address], OTR/L Acute [employer] Pager [phone] Office [phone]     Almon Register 11/10/2020, 11:25 AM

## 2020-11-10 NOTE — Progress Notes (Signed)
°  Echocardiogram 2D Echocardiogram has been performed.  Gerald Jenkins 11/10/2020, 8:56 AM

## 2020-11-10 NOTE — Discharge Summary (Signed)
Physician Discharge Summary  Gerald Jenkins KPT:[id number] DOB: 23-Oct-1951 DOA: 11/09/2020  PCP: Gerald Borg, MD  Admit date: 11/09/2020 Discharge date: 11/10/2020  Admitted From: Home Disposition: Home  Recommendations for Outpatient Follow-up:  1. Follow-up with Dr. Jenny Reichmann in 1 week 2. Dr. Jenny Reichmann: Please reevaluate blood pressure on new amlodipine 3. Dr. Jenny Reichmann: Please recheck lipids/LFTs in 3 months 4. Follow-up with [facility] in 6 weeks     Home Health: None Equipment/Devices: None  Discharge Condition: Good CODE STATUS: Full Diet recommendation: Cardiac  Brief/Interim Summary: Gerald Jenkins is a 69 y.o. M with no vascular PMHx, but hx melanoma in past who presented with balance issues, speech changes and facial droop.  Patient fell the night prior to admission around 11 PM while cleaning the house, then went to bed in the morning felt off balance and leaning to the right side and had speech changes and facial droop.  Patient fell 2 more times that day, and eventually was brought to the ER.  In the ER CT head unremarkable, CTA head and neck without acute finding.  He was outside the window for TPA, but symptoms were also completely resolved and so he was admitted for CVA work-up.     PRINCIPAL HOSPITAL DIAGNOSIS: Acute ischemic stroke    Discharge Diagnoses:   Acute ischemic stroke -Non-invasive angiography showed normal carotids, otherwise mild atherosclerosis -Echocardiogram showed no cardiogenic source of embolism -Lipids ordered: LDL elevated at 83, discharged on Crestor, which he had stopped -Aspirin ordered at admission --> discharged on aspirin plus Plavix for 3 weeks, followed by low-dose aspirin alone -Atrial fibrillation: Not present on telemetry -tPA not given because outside the window -Dysphagia screen ordered in ER -PT eval ordered: recommended no PT follow-up -Smoking cessation: Strongly recommended, modalities discussed     Alcohol  use Moderation recommended, association with hypertension reiterated.  Hypertension Patient admitted with new stroke, blood pressure persistently in the 160s and 170s for the last 24 hours. -Discharged on new amlodipine and PCP follow-up          Discharge Instructions  Discharge Instructions    Ambulatory referral to Neurology   Complete by: As directed    Follow up in stroke clinic at [facility] with Frann Rider, NP in about 4 weeks. If not available, consider Dr. Antony Contras, Dr. Bess Harvest, or Dr. Sarina Ill.   Diet - low sodium heart healthy   Complete by: As directed    Discharge instructions   Complete by: As directed    From Dr. Loleta Books: You were admitted with a small stroke.  Our testing here showed no specific cause of the stroke, other than the factors we discussed, having to do with smoking, blood pressure, alcohol.   To prevent future strokes: Take aspirin 81 mg daily plus clopidogrel/Plavix 75 mg once daily for the next three weeks After 3 weeks, take aspirin 81 mg daily indefinitely  Resume taking rosuvastatin/Crestor 20 mg nightly  Your blood pressure here was elevated, please start the new medicine amlodipine/Norvasc Take amlodipine/Norvasc 5 mg daily Follow up with Dr. Jenny Reichmann in 1 week for follow up blood pressure, and general hospital follow up   Increase activity slowly   Complete by: As directed      Allergies as of 11/10/2020      Reactions   Penicillins Other (See Comments)   Childhood allergy      Medication List    TAKE these medications   acetaminophen 500 MG tablet Commonly  known as: TYLENOL Take 1,000 mg by mouth every 6 (six) hours as needed for mild pain.   amLODipine 5 MG tablet Commonly known as: NORVASC Take 1 tablet (5 mg total) by mouth daily.   aspirin 81 MG EC tablet Take 1 tablet (81 mg total) by mouth daily. Swallow whole. Start taking on: November 11, 2020   clopidogrel 75 MG  tablet Commonly known as: PLAVIX Take 1 tablet (75 mg total) by mouth daily. Start taking on: November 11, 2020   Influenza vac split quadrivalent PF 0.5 ML injection Commonly known as: FLUZONE HIGH-DOSE Fluzone High-Dose 2018-2019 (PF) 180 mcg/0.5 mL intramuscular syringe  ADM 0.5ML IM UTD   pregabalin 75 MG capsule Commonly known as: LYRICA Take 75 mg by mouth 2 (two) times daily.   rosuvastatin 40 MG tablet Commonly known as: CRESTOR Take 0.5 tablets (20 mg total) by mouth daily.   sildenafil 100 MG tablet Commonly known as: Viagra TAKE 1 TABLET BY MOUTH EVERY OTHER DAY AS NEEDED What changed:   how much to take  how to take this  when to take this  reasons to take this  additional instructions       Follow-up Information    [facility] Follow up in 4 week(s).   Specialty: Neurology Why: stroke clinic. office will call with appt date and time Contact information: [place] [phone]             Allergies  Allergen Reactions  . Penicillins Other (See Comments)    Childhood allergy    Consultations:  Neurology   Procedures/Studies: CT Angio Head W or Wo Contrast  Result Date: 11/09/2020 CLINICAL DATA:  Patient fell yesterday. Awoke today with right-sided weakness, right-sided facial droop and slurred speech. The folks going on her arm and EXAM: CT ANGIOGRAPHY HEAD AND NECK CT PERFUSION BRAIN TECHNIQUE: Multidetector CT imaging of the head and neck was performed using the standard protocol during bolus administration of intravenous contrast. Multiplanar CT image reconstructions and MIPs were obtained to evaluate the vascular anatomy. Carotid stenosis measurements (when applicable) are obtained utilizing NASCET criteria, using the distal internal carotid diameter as the denominator. Multiphase CT imaging of the brain was performed following IV bolus contrast injection. Subsequent  parametric perfusion maps were calculated using RAPID software. CONTRAST:  177mL OMNIPAQUE IOHEXOL 350 MG/ML SOLN COMPARISON:  None. FINDINGS: CT HEAD FINDINGS Brain: No acute CT finding. Chronic small-vessel ischemic changes of the cerebral hemispheric white matter. 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 No sign of acute infarction, mass lesion, hemorrhage, hydrocephalus or extra-axial collection. Vascular: There is atherosclerotic calcification of the major vessels at the base of the brain. Skull: Negative Sinuses/Orbits: Clear/normal Other: None ASPECTS ([place] Stroke Program Early CT Score) - Ganglionic level infarction (caudate, lentiform nuclei, internal capsule, insula, M1-M3 cortex): 7 - Supraganglionic infarction (M4-M6 cortex): 3 Total score (0-10 with 10 being normal): 10 Review of the MIP images confirms the above findings CTA NECK FINDINGS Aortic arch: Normal Right carotid system: Common carotid artery widely patent to the bifurcation. Mild calcified plaque at the carotid bifurcation and ICA bulb but no stenosis. Cervical ICA widely patent. Left carotid system: Common carotid artery widely patent to the bifurcation. Calcified plaque at the ICA bulb. No stenosis. Vertebral arteries: Both vertebral artery origins are widely patent. The right vertebral artery is dominant. Both vertebral arteries appear normal through the cervical region to the foramen magnum. Skeleton: Ordinary cervical  spondylosis. Other neck: No soft tissue lesion. Upper chest: Normal Review of the MIP images confirms the above findings CTA HEAD FINDINGS Anterior circulation: Both internal carotid arteries are widely patent through the skull base and siphon regions. Ordinary siphon atherosclerotic calcification but without stenosis greater than 30%. The anterior and middle cerebral vessels are patent without large or medium vessel occlusion, proximal stenosis, aneurysm or vascular malformation. Posterior  circulation: Both vertebral arteries are patent through the foramen magnum. The left terminates in PICA. The right vertebral artery supplies the basilar. No basilar stenosis. Posterior circulation branch vessels are patent. Venous sinuses: Patent and normal. Anatomic variants: None significant. Review of the MIP images confirms the above findings CT Brain Perfusion Findings: ASPECTS: 10 CBF (<30%) Volume: 15mL Perfusion (Tmax>6.0s) volume: 14mL Mismatch Volume: 24mL Infarction Location:None IMPRESSION: 1. No acute finding. Chronic small-vessel ischemic changes of the white matter. 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 2. No large or medium vessel occlusion. 3. Mild nonstenotic atherosclerotic disease at both carotid bifurcations. 4. Normal perfusion study. 5. These results were called by telephone at the time of interpretation on 11/09/2020 at 3:07 pm to provider [facility] , who verbally acknowledged these results. Electronically Signed   By: Nelson Chimes M.D.   On: 11/09/2020 15:08   CT ANGIO NECK W OR WO CONTRAST  Result Date: 11/09/2020 CLINICAL DATA:  Patient fell yesterday. Awoke today with right-sided weakness, right-sided facial droop and slurred speech. The folks going on her arm and EXAM: CT ANGIOGRAPHY HEAD AND NECK CT PERFUSION BRAIN TECHNIQUE: Multidetector CT imaging of the head and neck was performed using the standard protocol during bolus administration of intravenous contrast. Multiplanar CT image reconstructions and MIPs were obtained to evaluate the vascular anatomy. Carotid stenosis measurements (when applicable) are obtained utilizing NASCET criteria, using the distal internal carotid diameter as the denominator. Multiphase CT imaging of the brain was performed following IV bolus contrast injection. Subsequent parametric perfusion maps were calculated using RAPID software. CONTRAST:  172mL OMNIPAQUE IOHEXOL 350 MG/ML SOLN COMPARISON:  None. FINDINGS: CT HEAD  FINDINGS Brain: No acute CT finding. Chronic small-vessel ischemic changes of the cerebral hemispheric white matter. 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 No sign of acute infarction, mass lesion, hemorrhage, hydrocephalus or extra-axial collection. Vascular: There is atherosclerotic calcification of the major vessels at the base of the brain. Skull: Negative Sinuses/Orbits: Clear/normal Other: None ASPECTS ([place] Stroke Program Early CT Score) - Ganglionic level infarction (caudate, lentiform nuclei, internal capsule, insula, M1-M3 cortex): 7 - Supraganglionic infarction (M4-M6 cortex): 3 Total score (0-10 with 10 being normal): 10 Review of the MIP images confirms the above findings CTA NECK FINDINGS Aortic arch: Normal Right carotid system: Common carotid artery widely patent to the bifurcation. Mild calcified plaque at the carotid bifurcation and ICA bulb but no stenosis. Cervical ICA widely patent. Left carotid system: Common carotid artery widely patent to the bifurcation. Calcified plaque at the ICA bulb. No stenosis. Vertebral arteries: Both vertebral artery origins are widely patent. The right vertebral artery is dominant. Both vertebral arteries appear normal through the cervical region to the foramen magnum. Skeleton: Ordinary cervical spondylosis. Other neck: No soft tissue lesion. Upper chest: Normal Review of the MIP images confirms the above findings CTA HEAD FINDINGS Anterior circulation: Both internal carotid arteries are widely patent through the skull base and siphon regions. Ordinary siphon atherosclerotic calcification but without stenosis greater than 30%. The anterior and middle cerebral vessels are patent without large  or medium vessel occlusion, proximal stenosis, aneurysm or vascular malformation. Posterior circulation: Both vertebral arteries are patent through the foramen magnum. The left terminates in PICA. The right vertebral artery supplies the basilar. No  basilar stenosis. Posterior circulation branch vessels are patent. Venous sinuses: Patent and normal. Anatomic variants: None significant. Review of the MIP images confirms the above findings CT Brain Perfusion Findings: ASPECTS: 10 CBF (<30%) Volume: 14mL Perfusion (Tmax>6.0s) volume: 32mL Mismatch Volume: 27mL Infarction Location:None IMPRESSION: 1. No acute finding. Chronic small-vessel ischemic changes of the white matter. 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 2. No large or medium vessel occlusion. 3. Mild nonstenotic atherosclerotic disease at both carotid bifurcations. 4. Normal perfusion study. 5. These results were called by telephone at the time of interpretation on 11/09/2020 at 3:07 pm to provider [facility] , who verbally acknowledged these results. Electronically Signed   By: Nelson Chimes M.D.   On: 11/09/2020 15:08   MR BRAIN WO CONTRAST  Result Date: 11/09/2020 CLINICAL DATA:  Neuro deficit, acute, stroke suspected. Fall today. Right-sided weakness. EXAM: MRI HEAD WITHOUT CONTRAST TECHNIQUE: Multiplanar, multiecho pulse sequences of the brain and surrounding structures were obtained without intravenous contrast. COMPARISON:  CTA head and CT perfusion head 11/09/2020 FINDINGS: Brain: The diffusion-weighted images demonstrate subtle restricted diffusion involving the posterior limb of the left internal capsule. No cortical infarct is present. Mild generalized atrophy is present. Moderate periventricular white matter changes extend into the corona radiata. A remote lacunar infarct is present in the right centrum semi ovale. Remote lacunar infarcts are present in the right thalamus. Brainstem and cerebellum are unremarkable. Internal auditory canals are within normal limits bilaterally. Vascular: Flow is present in the major intracranial arteries. Skull and upper cervical spine: The craniocervical junction is normal. Upper cervical spine is within normal limits. Marrow  signal is unremarkable. Sinuses/Orbits: The paranasal sinuses and mastoid air cells are clear. Bilateral lens replacements are noted. Globes and orbits are otherwise unremarkable. IMPRESSION: 1. Subtle acute/subacute nonhemorrhagic infarct involving the posterior limb of the left internal capsule. This corresponds with right-sided weakness. 2. Atrophy and white matter disease likely reflects the sequela of chronic microvascular ischemia. 3. Remote lacunar infarcts of the right thalamus and right centrum semi ovale. These results were called by telephone at the time of interpretation on 11/09/2020 at 5:22 pm to Dr. Neysa Bonito, who verbally acknowledged these results. Electronically Signed   By: San Morelle M.D.   On: 11/09/2020 17:23   CT CEREBRAL PERFUSION W CONTRAST  Result Date: 11/09/2020 CLINICAL DATA:  Patient fell yesterday. Awoke today with right-sided weakness, right-sided facial droop and slurred speech. The folks going on her arm and EXAM: CT ANGIOGRAPHY HEAD AND NECK CT PERFUSION BRAIN TECHNIQUE: Multidetector CT imaging of the head and neck was performed using the standard protocol during bolus administration of intravenous contrast. Multiplanar CT image reconstructions and MIPs were obtained to evaluate the vascular anatomy. Carotid stenosis measurements (when applicable) are obtained utilizing NASCET criteria, using the distal internal carotid diameter as the denominator. Multiphase CT imaging of the brain was performed following IV bolus contrast injection. Subsequent parametric perfusion maps were calculated using RAPID software. CONTRAST:  166mL OMNIPAQUE IOHEXOL 350 MG/ML SOLN COMPARISON:  None. FINDINGS: CT HEAD FINDINGS Brain: No acute CT finding. Chronic small-vessel ischemic changes of the cerebral hemispheric white matter. 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 No sign of acute infarction, mass lesion, hemorrhage, hydrocephalus or extra-axial collection.  Vascular: There is atherosclerotic calcification  of the major vessels at the base of the brain. Skull: Negative Sinuses/Orbits: Clear/normal Other: None ASPECTS ([place] Stroke Program Early CT Score) - Ganglionic level infarction (caudate, lentiform nuclei, internal capsule, insula, M1-M3 cortex): 7 - Supraganglionic infarction (M4-M6 cortex): 3 Total score (0-10 with 10 being normal): 10 Review of the MIP images confirms the above findings CTA NECK FINDINGS Aortic arch: Normal Right carotid system: Common carotid artery widely patent to the bifurcation. Mild calcified plaque at the carotid bifurcation and ICA bulb but no stenosis. Cervical ICA widely patent. Left carotid system: Common carotid artery widely patent to the bifurcation. Calcified plaque at the ICA bulb. No stenosis. Vertebral arteries: Both vertebral artery origins are widely patent. The right vertebral artery is dominant. Both vertebral arteries appear normal through the cervical region to the foramen magnum. Skeleton: Ordinary cervical spondylosis. Other neck: No soft tissue lesion. Upper chest: Normal Review of the MIP images confirms the above findings CTA HEAD FINDINGS Anterior circulation: Both internal carotid arteries are widely patent through the skull base and siphon regions. Ordinary siphon atherosclerotic calcification but without stenosis greater than 30%. The anterior and middle cerebral vessels are patent without large or medium vessel occlusion, proximal stenosis, aneurysm or vascular malformation. Posterior circulation: Both vertebral arteries are patent through the foramen magnum. The left terminates in PICA. The right vertebral artery supplies the basilar. No basilar stenosis. Posterior circulation branch vessels are patent. Venous sinuses: Patent and normal. Anatomic variants: None significant. Review of the MIP images confirms the above findings CT Brain Perfusion Findings: ASPECTS: 10 CBF (<30%) Volume: 75mL Perfusion (Tmax>6.0s)  volume: 60mL Mismatch Volume: 30mL Infarction Location:None IMPRESSION: 1. No acute finding. Chronic small-vessel ischemic changes of the white matter. 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 2. No large or medium vessel occlusion. 3. Mild nonstenotic atherosclerotic disease at both carotid bifurcations. 4. Normal perfusion study. 5. These results were called by telephone at the time of interpretation on 11/09/2020 at 3:07 pm to provider [facility] , who verbally acknowledged these results. Electronically Signed   By: Nelson Chimes M.D.   On: 11/09/2020 15:08   ECHOCARDIOGRAM COMPLETE  Result Date: 11/10/2020    ECHOCARDIOGRAM REPORT   Patient Name:   Gerald Jenkins Date of Exam: 11/10/2020 Medical Rec #:  [id number]      Height:       73.0 in Accession #:    [id number]     Weight:       172.9 lb Date of Birth:  Feb 04, 1951       BSA:          2.022 m Patient Age:    69 years       BP:           160/94 mmHg Patient Gender: M              HR:           70 bpm. Exam Location:  Inpatient Procedure: 2D Echo Indications:    stroke 434.91  History:        Patient has no prior history of Echocardiogram examinations.                 Risk Factors:Dyslipidemia and Current Smoker. Melanoma.  Sonographer:    Jannett Celestine RDCS (AE) Referring Phys: [id number] JARED E SEGAL IMPRESSIONS  1. Left ventricular ejection fraction, by estimation, is 60 to 65%. The left ventricle has normal function. The left ventricle has no regional wall motion  abnormalities. Left ventricular diastolic parameters were normal.  2. Right ventricular systolic function is normal. The right ventricular size is normal.  3. The mitral valve is normal in structure. Trivial mitral valve regurgitation. No evidence of mitral stenosis.  4. The aortic valve is normal in structure. Aortic valve regurgitation is not visualized. No aortic stenosis is present.  5. The inferior vena cava is normal in size with greater than 50% respiratory  variability, suggesting right atrial pressure of 3 mmHg. FINDINGS  Left Ventricle: Left ventricular ejection fraction, by estimation, is 60 to 65%. The left ventricle has normal function. The left ventricle has no regional wall motion abnormalities. The left ventricular internal cavity size was normal in size. There is  no left ventricular hypertrophy. Left ventricular diastolic parameters were normal. Normal left ventricular filling pressure. Right Ventricle: The right ventricular size is normal. No increase in right ventricular wall thickness. Right ventricular systolic function is normal. Left Atrium: Left atrial size was normal in size. Right Atrium: Right atrial size was normal in size. Pericardium: There is no evidence of pericardial effusion. Mitral Valve: The mitral valve is normal in structure. Trivial mitral valve regurgitation. No evidence of mitral valve stenosis. Tricuspid Valve: The tricuspid valve is normal in structure. Tricuspid valve regurgitation is not demonstrated. No evidence of tricuspid stenosis. Aortic Valve: The aortic valve is normal in structure. Aortic valve regurgitation is not visualized. No aortic stenosis is present. Pulmonic Valve: The pulmonic valve was normal in structure. Pulmonic valve regurgitation is not visualized. No evidence of pulmonic stenosis. Aorta: The aortic root is normal in size and structure. Venous: The inferior vena cava is normal in size with greater than 50% respiratory variability, suggesting right atrial pressure of 3 mmHg. IAS/Shunts: No atrial level shunt detected by color flow Doppler.  LEFT VENTRICLE PLAX 2D LVIDd:         3.60 cm  Diastology LVIDs:         2.30 cm  LV e' medial:    9.03 cm/s LV PW:         0.80 cm  LV E/e' medial:  9.3 LV IVS:        0.90 cm  LV e' lateral:   10.10 cm/s LVOT diam:     2.00 cm  LV E/e' lateral: 8.3 LV SV:         55 LV SV Index:   27 LVOT Area:     3.14 cm  RIGHT VENTRICLE RV S prime:     12.00 cm/s TAPSE (M-mode): 2.2  cm LEFT ATRIUM           Index LA diam:      2.50 cm 1.24 cm/m LA Vol (A2C): 26.7 ml 13.20 ml/m  AORTIC VALVE LVOT Vmax:   76.40 cm/s LVOT Vmean:  57.700 cm/s LVOT VTI:    0.174 m  AORTA Ao Root diam: 2.60 cm MITRAL VALVE MV Area (PHT): 4.41 cm    SHUNTS MV Decel Time: 172 msec    Systemic VTI:  0.17 m MV E velocity: 83.60 cm/s  Systemic Diam: 2.00 cm MV A velocity: 62.60 cm/s MV E/A ratio:  1.34 Mihai Croitoru MD Electronically signed by Sanda Klein MD Signature Date/Time: 11/10/2020/10:47:42 AM    Final       Subjective: Patient is feeling well, at his baseline.  He has no focal weakness, numbness, dizziness, vision changes, speech disturbance, facial droop.  Discharge Exam: Vitals:   11/09/20 2007 11/10/20 0500  BP: (!) 159/93 Marland Kitchen)  160/94  Pulse: 91 77  Resp: 20 19  Temp: 98.2 F (36.8 C) 97.7 F (36.5 C)  SpO2: 96% 99%   Vitals:   11/09/20 1726 11/09/20 1841 11/09/20 2007 11/10/20 0500  BP:  (!) 169/96 (!) 159/93 (!) 160/94  Pulse:  78 91 77  Resp:  18 20 19   Temp:  97.6 F (36.4 C) 98.2 F (36.8 C) 97.7 F (36.5 C)  TempSrc:  Oral  Oral  SpO2:  99% 96% 99%  Weight: 78.4 kg     Height: 6\' 1"  (1.854 m)       General: Pt is alert, awake, not in acute distress Cardiovascular: RRR, nl S1-S2, no murmurs appreciated.   No LE edema.   Respiratory: Normal respiratory rate and rhythm.  CTAB without rales or wheezes. Abdominal: Abdomen soft and non-tender.  No distension or HSM.   Neuro/Psych: Strength symmetric in upper and lower extremities.  Judgment and insight appear normal.   The results of significant diagnostics from this hospitalization (including imaging, microbiology, ancillary and laboratory) are listed below for reference.     Microbiology: Recent Results (from the past 240 hour(s))  Resp Panel by RT-PCR (Flu A&B, Covid) Nasopharyngeal Swab     Status: None   Collection Time: 11/09/20  3:22 PM   Specimen: Nasopharyngeal Swab; Nasopharyngeal(NP) swabs in  vial transport medium  Result Value Ref Range Status   SARS Coronavirus 2 by RT PCR NEGATIVE NEGATIVE Final    Comment: (NOTE) SARS-CoV-2 target nucleic acids are NOT DETECTED.  The SARS-CoV-2 RNA is generally detectable in upper respiratory specimens during the acute phase of infection. The lowest concentration of SARS-CoV-2 viral copies this assay can detect is 138 copies/mL. A negative result does not preclude SARS-Cov-2 infection and should not be used as the sole basis for treatment or other patient management decisions. A negative result may occur with  improper specimen collection/handling, submission of specimen other than nasopharyngeal swab, presence of viral mutation(s) within the areas targeted by this assay, and inadequate number of viral copies(<138 copies/mL). A negative result must be combined with clinical observations, patient history, and epidemiological information. The expected result is Negative.  Fact Sheet for Patients:  EntrepreneurPulse.com.au  Fact Sheet for Healthcare Providers:  IncredibleEmployment.be  This test is no t yet approved or cleared by the [place] FDA and  has been authorized for detection and/or diagnosis of SARS-CoV-2 by FDA under an Emergency Use Authorization (EUA). This EUA will remain  in effect (meaning this test can be used) for the duration of the COVID-19 declaration under Section 564(b)(1) of the Act, 21 U.S.C.section 360bbb-3(b)(1), unless the authorization is terminated  or revoked sooner.       Influenza A by PCR NEGATIVE NEGATIVE Final   Influenza B by PCR NEGATIVE NEGATIVE Final    Comment: (NOTE) The Xpert Xpress SARS-CoV-2/FLU/RSV plus assay is intended as an aid in the diagnosis of influenza from Nasopharyngeal swab specimens and should not be used as a sole basis for treatment. Nasal washings and aspirates are unacceptable for Xpert Xpress SARS-CoV-2/FLU/RSV testing.  Fact  Sheet for Patients: EntrepreneurPulse.com.au  Fact Sheet for Healthcare Providers: IncredibleEmployment.be  This test is not yet approved or cleared by the [place] FDA and has been authorized for detection and/or diagnosis of SARS-CoV-2 by FDA under an Emergency Use Authorization (EUA). This EUA will remain in effect (meaning this test can be used) for the duration of the COVID-19 declaration under Section 564(b)(1) of the Act, 21 U.S.C.  section 360bbb-3(b)(1), unless the authorization is terminated or revoked.  Performed at [facility], [place] [address], [place], [place] [address]      Labs: BNP (last 3 results) No results for input(s): BNP in the last 8760 hours. Basic Metabolic Panel: Recent Labs  Lab 11/06/20 0750 11/09/20 1306 11/09/20 1316  NA 135 132* 134*  K 4.5 3.6 4.0  CL 98 96* 96*  CO2 29 24  --   GLUCOSE 78 106* 109*  BUN 16 19 25*  CREATININE 1.16 1.12 1.00  CALCIUM 9.7 9.7  --   MG  --  2.2  --   PHOS  --  2.7  --    Liver Function Tests: Recent Labs  Lab 11/06/20 0750 11/09/20 1306  AST 14 24  ALT 12 18  ALKPHOS 40 42  BILITOT 0.5 0.4  PROT 7.3 8.4*  ALBUMIN 4.6 5.0   No results for input(s): LIPASE, AMYLASE in the last 168 hours. No results for input(s): AMMONIA in the last 168 hours. CBC: Recent Labs  Lab 11/06/20 0750 11/09/20 1316 11/09/20 1351  WBC 9.1  --  14.3*  NEUTROABS 5.5  --  11.1*  HGB 14.9 15.6 14.2  HCT 43.7 46.0 40.8  MCV 90.2  --  90.3  PLT 340.0  --  286   Cardiac Enzymes: No results for input(s): CKTOTAL, CKMB, CKMBINDEX, TROPONINI in the last 168 hours. BNP: Invalid input(s): POCBNP CBG: No results for input(s): GLUCAP in the last 168 hours. D-Dimer No results for input(s): DDIMER in the last 72 hours. Hgb A1c Recent Labs    11/09/20 1351  HGBA1C 5.5   Lipid Profile No results for input(s): CHOL, HDL, LDLCALC, TRIG, CHOLHDL, LDLDIRECT in the  last 72 hours. Thyroid function studies No results for input(s): TSH, T4TOTAL, T3FREE, THYROIDAB in the last 72 hours.  Invalid input(s): FREET3 Anemia work up No results for input(s): VITAMINB12, FOLATE, FERRITIN, TIBC, IRON, RETICCTPCT in the last 72 hours. Urinalysis    Component Value Date/Time   COLORURINE STRAW (A) 11/09/2020 1520   APPEARANCEUR CLEAR 11/09/2020 1520   LABSPEC 1.021 11/09/2020 1520   PHURINE 6.0 11/09/2020 1520   GLUCOSEU NEGATIVE 11/09/2020 1520   GLUCOSEU NEGATIVE 11/06/2020 0750   HGBUR NEGATIVE 11/09/2020 [place] 11/09/2020 1520   KETONESUR NEGATIVE 11/09/2020 1520   PROTEINUR NEGATIVE 11/09/2020 1520   UROBILINOGEN 0.2 11/06/2020 0750   NITRITE NEGATIVE 11/09/2020 [place] 11/09/2020 1520   Sepsis Labs Invalid input(s): PROCALCITONIN,  WBC,  LACTICIDVEN Microbiology Recent Results (from the past 240 hour(s))  Resp Panel by RT-PCR (Flu A&B, Covid) Nasopharyngeal Swab     Status: None   Collection Time: 11/09/20  3:22 PM   Specimen: Nasopharyngeal Swab; Nasopharyngeal(NP) swabs in vial transport medium  Result Value Ref Range Status   SARS Coronavirus 2 by RT PCR NEGATIVE NEGATIVE Final    Comment: (NOTE) SARS-CoV-2 target nucleic acids are NOT DETECTED.  The SARS-CoV-2 RNA is generally detectable in upper respiratory specimens during the acute phase of infection. The lowest concentration of SARS-CoV-2 viral copies this assay can detect is 138 copies/mL. A negative result does not preclude SARS-Cov-2 infection and should not be used as the sole basis for treatment or other patient management decisions. A negative result may occur with  improper specimen collection/handling, submission of specimen other than nasopharyngeal swab, presence of viral mutation(s) within the areas targeted by this assay, and inadequate number of viral copies(<138 copies/mL). A  negative result must be combined with clinical  observations, patient history, and epidemiological information. The expected result is Negative.  Fact Sheet for Patients:  EntrepreneurPulse.com.au  Fact Sheet for Healthcare Providers:  IncredibleEmployment.be  This test is no t yet approved or cleared by the [place] FDA and  has been authorized for detection and/or diagnosis of SARS-CoV-2 by FDA under an Emergency Use Authorization (EUA). This EUA will remain  in effect (meaning this test can be used) for the duration of the COVID-19 declaration under Section 564(b)(1) of the Act, 21 U.S.C.section 360bbb-3(b)(1), unless the authorization is terminated  or revoked sooner.       Influenza A by PCR NEGATIVE NEGATIVE Final   Influenza B by PCR NEGATIVE NEGATIVE Final    Comment: (NOTE) The Xpert Xpress SARS-CoV-2/FLU/RSV plus assay is intended as an aid in the diagnosis of influenza from Nasopharyngeal swab specimens and should not be used as a sole basis for treatment. Nasal washings and aspirates are unacceptable for Xpert Xpress SARS-CoV-2/FLU/RSV testing.  Fact Sheet for Patients: EntrepreneurPulse.com.au  Fact Sheet for Healthcare Providers: IncredibleEmployment.be  This test is not yet approved or cleared by the [place] FDA and has been authorized for detection and/or diagnosis of SARS-CoV-2 by FDA under an Emergency Use Authorization (EUA). This EUA will remain in effect (meaning this test can be used) for the duration of the COVID-19 declaration under Section 564(b)(1) of the Act, 21 U.S.C. section 360bbb-3(b)(1), unless the authorization is terminated or revoked.  Performed at [facility], [place] [address] [place], [place] [address]      Time coordinating discharge: 35 minutes The [place] controlled substances registry was reviewed for this patient      SIGNED:   Edwin Dada, MD  [facility] 11/10/2020, 6:47 PM

## 2020-11-10 NOTE — Progress Notes (Signed)
Gerald Jenkins arrived to unit successfully, Alert/oriented x4. No c/o pain, was able to walk from stretcher to bed with no gait impairments. NIH score is zero.  No dizziness or SOB. Will treat and follow orders as implemented. No complaints from patient at this time.

## 2020-11-10 NOTE — TOC Transition Note (Signed)
Transition of Care [facility]) - CM/SW Discharge Note   Patient Details  Name: Gerald Jenkins MRN: [id number] Date of Birth: 1951/01/01  [facility] CM/SW Contact:  Pollie Friar, RN Phone Number: 11/10/2020, 3:14 PM   Clinical Narrative:    Pt discharging home with self care. No f/u per PT/OT and no DME needs.  CM provided him with inpatient/ outpatient alcohol counseling resources.  Pt has transportation home.    Final next level of care: Home/Self Care Barriers to Discharge: No Barriers Identified   Patient Goals and CMS Choice        Discharge Placement                       Discharge Plan and Services                                     Social Determinants of Health (SDOH) Interventions     Readmission Risk Interventions No flowsheet data found.

## 2020-11-10 NOTE — Consult Note (Signed)
Stroke Neurology Consultation Note  Consult Requested by: Dr. Loleta Books  Reason for Consult: stroke  Consult Date:  11/10/20  The history was obtained from the patient and chart.  During history and examination, all items were able to obtain unless otherwise noted.  History of Present Illness:  Gerald Jenkins is an 69 y.o. Caucasian male with PMH of melanoma and hyperlipidemia who presented with sudden onset of right-sided weakness, slurred speech and gait imbalance yesterday upon arising from sleep.  His symptoms were not improving finally went to [facility], ER where he was evaluated by telemetry neurology and MRI scan of the brain was obtained which showed left subcortical weakly diffusion positive lesion compatible with an infarct.  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  CT angiogram of the brain and neck did not show large vessel stenosis or occlusion.  CT perfusion study was normal.  Patient states his speech appears to have improved though he still has mild right-sided weakness.  He underwent echocardiogram this morning results are pending.  Hemoglobin A1c is normal.  Urine drug screen is negative.  Lipid profile from 11/06/2020 shows elevated LDL cholesterol of 183 mg percent.  Patient was supposed to be on Crestor 40 mg but states that he stopped taking it 6 months ago.  He smokes about half pack cigarettes per day and is willing to quit.  Denies any prior history of strokes TIAs or significant neurological problems.  Date last known well: Date: 11/08/2020 Time last known well: Time: 11:15 tPA Given: No: outside the window MRS:  0  Past Medical History:  Diagnosis Date  . ANXIETY 01/19/2008  . Cervicalgia 01/19/2008  . DISORDER OF BONE AND CARTILAGE UNSPECIFIED 01/11/2008  . HYPERLIPIDEMIA 01/19/2008  . Lumbar spinal stenosis 11/08/2020  . Melanoma ([place]) 09/09/2015  . 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 PSA, INCREASED 07/12/2010  .  RASH-NONVESICULAR 01/19/2008     Past Surgical History:  Procedure Laterality Date  . CATARACT EXTRACTION    . lumbar disease      Family History  Problem Relation Age of Onset  . Lumbar disc disease Father   . Multiple sclerosis Other   . Diabetes Other   . Cancer Other        lung cancer     Social History:  reports that he has been smoking. He has never used smokeless tobacco. He reports current alcohol use of about 10.0 standard drinks of alcohol per week. No history on file for drug use.  Review of Systems: A full ROS was attempted today and was able to be performed.  Systems assessed include - Constitutional, Eyes, HENT, Respiratory, Cardiovascular, Gastrointestinal, Genitourinary, Integument/breast, Hematologic/lymphatic, Musculoskeletal, Neurological, Behavioral/Psych, Endocrine, Allergic/Immunologic - with pertinent responses as per HPI.  Allergies:  Allergies  Allergen Reactions  . Penicillins Other (See Comments)    Childhood allergy     Medications: I have reviewed the patient's current medications.  Test Results: CBC:  Recent Labs  Lab 11/06/20 0750 11/06/20 0750 11/09/20 1316 11/09/20 1351  WBC 9.1  --   --  14.3*  NEUTROABS 5.5  --   --  11.1*  HGB 14.9   < > 15.6 14.2  HCT 43.7   < > 46.0 40.8  MCV 90.2  --   --  90.3  PLT 340.0  --   --  286   < > = values in this interval not displayed.   Basic Metabolic Panel:  Recent Labs  Lab 11/06/20 0750 11/06/20 0750 11/09/20 1306 11/09/20 1316  NA 135   < > 132* 134*  K 4.5   < > 3.6 4.0  CL 98   < > 96* 96*  CO2 29  --  24  --   GLUCOSE 78   < > 106* 109*  BUN 16   < > 19 25*  CREATININE 1.16   < > 1.12 1.00  CALCIUM 9.7  --  9.7  --   MG  --   --  2.2  --   PHOS  --   --  2.7  --    < > = values in this interval not displayed.   Liver Function Tests: Recent Labs  Lab 11/06/20 0750 11/09/20 1306  AST 14 24  ALT 12 18  ALKPHOS 40 42  BILITOT 0.5 0.4  PROT 7.3 8.4*  ALBUMIN 4.6 5.0    Coagulation Studies:  Recent Labs    11/09/20 1306  LABPROT 11.8  INR 0.9    Urinalysis:  Recent Labs  Lab 11/06/20 0750 11/09/20 1520  COLORURINE YELLOW STRAW*  LABSPEC 1.015 1.021  PHURINE 6.5 6.0  GLUCOSEU NEGATIVE NEGATIVE  HGBUR NEGATIVE NEGATIVE  BILIRUBINUR NEGATIVE NEGATIVE  KETONESUR NEGATIVE NEGATIVE  PROTEINUR  --  NEGATIVE  UROBILINOGEN 0.2  --   NITRITE NEGATIVE NEGATIVE  LEUKOCYTESUR NEGATIVE NEGATIVE   Microbiology:  Results for orders placed or performed during the hospital encounter of 11/09/20  Resp Panel by RT-PCR (Flu A&B, Covid) Nasopharyngeal Swab     Status: None   Collection Time: 11/09/20  3:22 PM   Specimen: Nasopharyngeal Swab; Nasopharyngeal(NP) swabs in vial transport medium  Result Value Ref Range Status   SARS Coronavirus 2 by RT PCR NEGATIVE NEGATIVE Final    Comment: (NOTE) SARS-CoV-2 target nucleic acids are NOT DETECTED.  The SARS-CoV-2 RNA is generally detectable in upper respiratory specimens during the acute phase of infection. The lowest concentration of SARS-CoV-2 viral copies this assay can detect is 138 copies/mL. A negative result does not preclude SARS-Cov-2 infection and should not be used as the sole basis for treatment or other patient management decisions. A negative result may occur with  improper specimen collection/handling, submission of specimen other than nasopharyngeal swab, presence of viral mutation(s) within the areas targeted by this assay, and inadequate number of viral copies(<138 copies/mL). A negative result must be combined with clinical observations, patient history, and epidemiological information. The expected result is Negative.  Fact Sheet for Patients:  EntrepreneurPulse.com.au  Fact Sheet for Healthcare Providers:  IncredibleEmployment.be  This test is no t yet approved or cleared by the [place] FDA and  has been authorized for detection and/or  diagnosis of SARS-CoV-2 by FDA under an Emergency Use Authorization (EUA). This EUA will remain  in effect (meaning this test can be used) for the duration of the COVID-19 declaration under Section 564(b)(1) of the Act, 21 U.S.C.section 360bbb-3(b)(1), unless the authorization is terminated  or revoked sooner.       Influenza A by PCR NEGATIVE NEGATIVE Final   Influenza B by PCR NEGATIVE NEGATIVE Final    Comment: (NOTE) The Xpert Xpress SARS-CoV-2/FLU/RSV plus assay is intended as an aid in the diagnosis of influenza from Nasopharyngeal swab specimens and should not be used as a sole basis for treatment. Nasal washings and aspirates are unacceptable for Xpert Xpress SARS-CoV-2/FLU/RSV testing.  Fact Sheet for Patients: EntrepreneurPulse.com.au  Fact Sheet for Healthcare Providers: IncredibleEmployment.be  This test  is not yet approved or cleared by the [place] and has been authorized for detection and/or diagnosis of SARS-CoV-2 by FDA under an Emergency Use Authorization (EUA). This EUA will remain in effect (meaning this test can be used) for the duration of the COVID-19 declaration under Section 564(b)(1) of the Act, 21 U.S.C. section 360bbb-3(b)(1), unless the authorization is terminated or revoked.  Performed at [facility], [place] [address], [place], [place] [address]    Lipid Panel:     Component Value Date/Time   CHOL 275 (H) 11/06/2020 0750   TRIG 96.0 11/06/2020 0750   HDL 73.20 11/06/2020 0750   CHOLHDL 4 11/06/2020 0750   VLDL 19.2 11/06/2020 0750   LDLCALC 183 (H) 11/06/2020 0750   HgbA1c:  Lab Results  Component Value Date   HGBA1C 5.5 11/09/2020   Urine Drug Screen:     Component Value Date/Time   LABOPIA NONE DETECTED 11/09/2020 1520   COCAINSCRNUR NONE DETECTED 11/09/2020 1520   LABBENZ NONE DETECTED 11/09/2020 1520   AMPHETMU NONE DETECTED 11/09/2020 1520   THCU NONE DETECTED  11/09/2020 1520   LABBARB NONE DETECTED 11/09/2020 1520    Alcohol Level:  Recent Labs  Lab 11/09/20 1306  ETH <10    CT Angio Head W or Wo Contrast  Result Date: 11/09/2020 CLINICAL DATA:  Patient fell yesterday. Awoke today with right-sided weakness, right-sided facial droop and slurred speech. The folks going on her arm and EXAM: CT ANGIOGRAPHY HEAD AND NECK CT PERFUSION BRAIN TECHNIQUE: Multidetector CT imaging of the head and neck was performed using the standard protocol during bolus administration of intravenous contrast. Multiplanar CT image reconstructions and MIPs were obtained to evaluate the vascular anatomy. Carotid stenosis measurements (when applicable) are obtained utilizing NASCET criteria, using the distal internal carotid diameter as the denominator. Multiphase CT imaging of the brain was performed following IV bolus contrast injection. Subsequent parametric perfusion maps were calculated using RAPID software. CONTRAST:  143mL OMNIPAQUE IOHEXOL 350 MG/ML SOLN COMPARISON:  None. FINDINGS: CT HEAD FINDINGS Brain: No acute CT finding. Chronic small-vessel ischemic changes of the cerebral hemispheric white matter. 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 No sign of acute infarction, mass lesion, hemorrhage, hydrocephalus or extra-axial collection. Vascular: There is atherosclerotic calcification of the major vessels at the base of the brain. Skull: Negative Sinuses/Orbits: Clear/normal Other: None ASPECTS ([place] Stroke Program Early CT Score) - Ganglionic level infarction (caudate, lentiform nuclei, internal capsule, insula, M1-M3 cortex): 7 - Supraganglionic infarction (M4-M6 cortex): 3 Total score (0-10 with 10 being normal): 10 Review of the MIP images confirms the above findings CTA NECK FINDINGS Aortic arch: Normal Right carotid system: Common carotid artery widely patent to the bifurcation. Mild calcified plaque at the carotid bifurcation and ICA bulb but no  stenosis. Cervical ICA widely patent. Left carotid system: Common carotid artery widely patent to the bifurcation. Calcified plaque at the ICA bulb. No stenosis. Vertebral arteries: Both vertebral artery origins are widely patent. The right vertebral artery is dominant. Both vertebral arteries appear normal through the cervical region to the foramen magnum. Skeleton: Ordinary cervical spondylosis. Other neck: No soft tissue lesion. Upper chest: Normal Review of the MIP images confirms the above findings CTA HEAD FINDINGS Anterior circulation: Both internal carotid arteries are widely patent through the skull base and siphon regions. Ordinary siphon atherosclerotic calcification but without stenosis greater than 30%. The anterior and middle cerebral vessels are patent without large or medium vessel occlusion, proximal stenosis,  aneurysm or vascular malformation. Posterior circulation: Both vertebral arteries are patent through the foramen magnum. The left terminates in PICA. The right vertebral artery supplies the basilar. No basilar stenosis. Posterior circulation branch vessels are patent. Venous sinuses: Patent and normal. Anatomic variants: None significant. Review of the MIP images confirms the above findings CT Brain Perfusion Findings: ASPECTS: 10 CBF (<30%) Volume: 61mL Perfusion (Tmax>6.0s) volume: 44mL Mismatch Volume: 51mL Infarction Location:None IMPRESSION: 1. No acute finding. Chronic small-vessel ischemic changes of the white matter. 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 2. No large or medium vessel occlusion. 3. Mild nonstenotic atherosclerotic disease at both carotid bifurcations. 4. Normal perfusion study. 5. These results were called by telephone at the time of interpretation on 11/09/2020 at 3:07 pm to provider [facility] , who verbally acknowledged these results. Electronically Signed   By: Nelson Chimes M.D.   On: 11/09/2020 15:08   CT ANGIO NECK W OR WO  CONTRAST  Result Date: 11/09/2020 CLINICAL DATA:  Patient fell yesterday. Awoke today with right-sided weakness, right-sided facial droop and slurred speech. The folks going on her arm and EXAM: CT ANGIOGRAPHY HEAD AND NECK CT PERFUSION BRAIN TECHNIQUE: Multidetector CT imaging of the head and neck was performed using the standard protocol during bolus administration of intravenous contrast. Multiplanar CT image reconstructions and MIPs were obtained to evaluate the vascular anatomy. Carotid stenosis measurements (when applicable) are obtained utilizing NASCET criteria, using the distal internal carotid diameter as the denominator. Multiphase CT imaging of the brain was performed following IV bolus contrast injection. Subsequent parametric perfusion maps were calculated using RAPID software. CONTRAST:  130mL OMNIPAQUE IOHEXOL 350 MG/ML SOLN COMPARISON:  None. FINDINGS: CT HEAD FINDINGS Brain: No acute CT finding. Chronic small-vessel ischemic changes of the cerebral hemispheric white matter. 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 No sign of acute infarction, mass lesion, hemorrhage, hydrocephalus or extra-axial collection. Vascular: There is atherosclerotic calcification of the major vessels at the base of the brain. Skull: Negative Sinuses/Orbits: Clear/normal Other: None ASPECTS ([place] Stroke Program Early CT Score) - Ganglionic level infarction (caudate, lentiform nuclei, internal capsule, insula, M1-M3 cortex): 7 - Supraganglionic infarction (M4-M6 cortex): 3 Total score (0-10 with 10 being normal): 10 Review of the MIP images confirms the above findings CTA NECK FINDINGS Aortic arch: Normal Right carotid system: Common carotid artery widely patent to the bifurcation. Mild calcified plaque at the carotid bifurcation and ICA bulb but no stenosis. Cervical ICA widely patent. Left carotid system: Common carotid artery widely patent to the bifurcation. Calcified plaque at the ICA bulb. No  stenosis. Vertebral arteries: Both vertebral artery origins are widely patent. The right vertebral artery is dominant. Both vertebral arteries appear normal through the cervical region to the foramen magnum. Skeleton: Ordinary cervical spondylosis. Other neck: No soft tissue lesion. Upper chest: Normal Review of the MIP images confirms the above findings CTA HEAD FINDINGS Anterior circulation: Both internal carotid arteries are widely patent through the skull base and siphon regions. Ordinary siphon atherosclerotic calcification but without stenosis greater than 30%. The anterior and middle cerebral vessels are patent without large or medium vessel occlusion, proximal stenosis, aneurysm or vascular malformation. Posterior circulation: Both vertebral arteries are patent through the foramen magnum. The left terminates in PICA. The right vertebral artery supplies the basilar. No basilar stenosis. Posterior circulation branch vessels are patent. Venous sinuses: Patent and normal. Anatomic variants: None significant. Review of the MIP images confirms the above findings CT Brain Perfusion Findings:  ASPECTS: 10 CBF (<30%) Volume: 68mL Perfusion (Tmax>6.0s) volume: 39mL Mismatch Volume: 33mL Infarction Location:None IMPRESSION: 1. No acute finding. Chronic small-vessel ischemic changes of the white matter. 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 2. No large or medium vessel occlusion. 3. Mild nonstenotic atherosclerotic disease at both carotid bifurcations. 4. Normal perfusion study. 5. These results were called by telephone at the time of interpretation on 11/09/2020 at 3:07 pm to provider [facility] , who verbally acknowledged these results. Electronically Signed   By: Nelson Chimes M.D.   On: 11/09/2020 15:08   MR BRAIN WO CONTRAST  Result Date: 11/09/2020 CLINICAL DATA:  Neuro deficit, acute, stroke suspected. Fall today. Right-sided weakness. EXAM: MRI HEAD WITHOUT CONTRAST TECHNIQUE:  Multiplanar, multiecho pulse sequences of the brain and surrounding structures were obtained without intravenous contrast. COMPARISON:  CTA head and CT perfusion head 11/09/2020 FINDINGS: Brain: The diffusion-weighted images demonstrate subtle restricted diffusion involving the posterior limb of the left internal capsule. No cortical infarct is present. Mild generalized atrophy is present. Moderate periventricular white matter changes extend into the corona radiata. A remote lacunar infarct is present in the right centrum semi ovale. Remote lacunar infarcts are present in the right thalamus. Brainstem and cerebellum are unremarkable. Internal auditory canals are within normal limits bilaterally. Vascular: Flow is present in the major intracranial arteries. Skull and upper cervical spine: The craniocervical junction is normal. Upper cervical spine is within normal limits. Marrow signal is unremarkable. Sinuses/Orbits: The paranasal sinuses and mastoid air cells are clear. Bilateral lens replacements are noted. Globes and orbits are otherwise unremarkable. IMPRESSION: 1. Subtle acute/subacute nonhemorrhagic infarct involving the posterior limb of the left internal capsule. This corresponds with right-sided weakness. 2. Atrophy and white matter disease likely reflects the sequela of chronic microvascular ischemia. 3. Remote lacunar infarcts of the right thalamus and right centrum semi ovale. These results were called by telephone at the time of interpretation on 11/09/2020 at 5:22 pm to Dr. Neysa Bonito, who verbally acknowledged these results. Electronically Signed   By: San Morelle M.D.   On: 11/09/2020 17:23   CT CEREBRAL PERFUSION W CONTRAST  Result Date: 11/09/2020 CLINICAL DATA:  Patient fell yesterday. Awoke today with right-sided weakness, right-sided facial droop and slurred speech. The folks going on her arm and EXAM: CT ANGIOGRAPHY HEAD AND NECK CT PERFUSION BRAIN TECHNIQUE: Multidetector CT imaging  of the head and neck was performed using the standard protocol during bolus administration of intravenous contrast. Multiplanar CT image reconstructions and MIPs were obtained to evaluate the vascular anatomy. Carotid stenosis measurements (when applicable) are obtained utilizing NASCET criteria, using the distal internal carotid diameter as the denominator. Multiphase CT imaging of the brain was performed following IV bolus contrast injection. Subsequent parametric perfusion maps were calculated using RAPID software. CONTRAST:  134mL OMNIPAQUE IOHEXOL 350 MG/ML SOLN COMPARISON:  None. FINDINGS: CT HEAD FINDINGS Brain: No acute CT finding. Chronic small-vessel ischemic changes of the cerebral hemispheric white matter. 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 No sign of acute infarction, mass lesion, hemorrhage, hydrocephalus or extra-axial collection. Vascular: There is atherosclerotic calcification of the major vessels at the base of the brain. Skull: Negative Sinuses/Orbits: Clear/normal Other: None ASPECTS ([place] Stroke Program Early CT Score) - Ganglionic level infarction (caudate, lentiform nuclei, internal capsule, insula, M1-M3 cortex): 7 - Supraganglionic infarction (M4-M6 cortex): 3 Total score (0-10 with 10 being normal): 10 Review of the MIP images confirms the above findings CTA NECK FINDINGS Aortic arch: Normal  Right carotid system: Common carotid artery widely patent to the bifurcation. Mild calcified plaque at the carotid bifurcation and ICA bulb but no stenosis. Cervical ICA widely patent. Left carotid system: Common carotid artery widely patent to the bifurcation. Calcified plaque at the ICA bulb. No stenosis. Vertebral arteries: Both vertebral artery origins are widely patent. The right vertebral artery is dominant. Both vertebral arteries appear normal through the cervical region to the foramen magnum. Skeleton: Ordinary cervical spondylosis. Other neck: No soft tissue  lesion. Upper chest: Normal Review of the MIP images confirms the above findings CTA HEAD FINDINGS Anterior circulation: Both internal carotid arteries are widely patent through the skull base and siphon regions. Ordinary siphon atherosclerotic calcification but without stenosis greater than 30%. The anterior and middle cerebral vessels are patent without large or medium vessel occlusion, proximal stenosis, aneurysm or vascular malformation. Posterior circulation: Both vertebral arteries are patent through the foramen magnum. The left terminates in PICA. The right vertebral artery supplies the basilar. No basilar stenosis. Posterior circulation branch vessels are patent. Venous sinuses: Patent and normal. Anatomic variants: None significant. Review of the MIP images confirms the above findings CT Brain Perfusion Findings: ASPECTS: 10 CBF (<30%) Volume: 30mL Perfusion (Tmax>6.0s) volume: 92mL Mismatch Volume: 27mL Infarction Location:None IMPRESSION: 1. No acute finding. Chronic small-vessel ischemic changes of the white matter. 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 2. No large or medium vessel occlusion. 3. Mild nonstenotic atherosclerotic disease at both carotid bifurcations. 4. Normal perfusion study. 5. These results were called by telephone at the time of interpretation on 11/09/2020 at 3:07 pm to provider [facility] , who verbally acknowledged these results. Electronically Signed   By: Nelson Chimes M.D.   On: 11/09/2020 15:08     EKG: normal sinus rhythm.   Physical Examination: Temp:  [97.6 F (36.4 C)-98.3 F (36.8 C)] 97.7 F (36.5 C) (11/26 0500) Pulse Rate:  [77-118] 77 (11/26 0500) Resp:  [13-20] 19 (11/26 0500) BP: (153-177)/(84-102) 160/94 (11/26 0500) SpO2:  [96 %-100 %] 99 % (11/26 0500) Weight:  [78.4 kg] 78.4 kg (11/25 1726) Pleasant middle-age Caucasian male not in distress. . Afebrile. Head is nontraumatic. Neck is supple without bruit.    Cardiac exam  no murmur or gallop. Lungs are clear to auscultation. Distal pulses are well felt. Neurological Exam ;  Awake  Alert oriented x 3. Normal speech and language.eye movements full without nystagmus.fundi were not visualized. Vision acuity and fields appear normal. Hearing is normal. Palatal movements are normal. Face symmetric. Tongue midline. Normal strength, tone, reflexes and coordination except mild 4+/5 right hip flexor weakness.  Foot tapping is diminished on the right compared to the left.. Normal sensation. Gait deferred.   Assessment:  [name] is a 69 y.o. male with history of melanoma, anxiety, HLD presenting with balance issues and fall.  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  Vascular risk factors of hyperlipidemia, borderline hypertension, silent lacunar strokes and smoking  CT head No acute stroke. Small vessel disease. Old R thalamic and R caudate body lacunes. ASPECTS 10.     CTA head & neck no LVO. Mild atherosclerosis B ICA bifurcations.   CT perfusion Unremarkable   MRI  L PLIC infarct. Small vessel disease. Atrophy. Old R thalamic and R centrum semiovale lacunes  2D Echo pending  LDL 183  HgbA1c 5.5   Lovenox 40 mg sq daily for VTE prophylaxis  No antithrombotic prior to admission,  now on aspirin 81 mg daily. Add plavix 75. Continue DAPT x 3 weeks then aspirin alone.    Therapy recommendations: Pending  Disposition:  Return home [place] for d/c from stroke standpoint after echo resulted and PT cleared Follow-up Stroke Clinic at [facility] in 4 weeks. Office will call with appointment date and time. Order placed.  Blood Pressure  Not on BP meds, no hx HTN  BP elevated 150-170s in hospital  Permissive hypertension (OK if < 220/120) but gradually normalize in 5-7 days  Avoid salt w/ low salt diet . Long-term BP goal normotensive  Hyperlipidemia  Home meds:  crestor 40 - quit taking for past 6 months, resumed  in hospital  LDL 183, goal < 70  Continue statin at discharge  Repeat LDL in 6-8 weeks, if not lowering, consider PCSK-9 as an OP  Can add Co-Q-Enzyme 10 if muscle pain on statin  Other Stroke Risk Factors  Advanced Age >/= 11   Cigarette smoker, advised to stop smoking  ETOH abuse, advised to drink no more than 2 drink(s) a day. On CIWA protocol  Other Active Problems  Recent steroid injections at ortho office this week  Hyponatremia   Hospital day # 0  Recommend aspirin 81 and Plavix 75 mg daily for 3 weeks followed by aspirin alone.  Patient counseled to be compliant with his medications and restart taking Crestor 40 mg daily.  He was also counseled to quit smoking completely and is agreeable.  Maintain aggressive risk factor modification.  Greater than 50% time during this 80-minute consultation visit was spent on counseling and coordination of care about his lacunar stroke and discussion about stroke prevention and treatment and answering questions.  Discussed with Dr. Hollace Hayward Thank you for this consultation and allowing [place] to participate in the care of this patient.  Antony Contras, MD  To contact Stroke Continuity provider, please refer to [email or web address] After hours, contact General Neurology

## 2020-11-10 NOTE — Progress Notes (Signed)
Nurse responded to patients bed alarm, noticed that patient was sitting on side of the bed and had used the urinal at bedside successfully, however he did not press his nurse call light for assistance (patient educated on importance) but as I was helping patient back in bed. He told me that he dropped his phone. Patient proceeded to get down on all four hands and knees and reach for phone. Retrieved phone and sat back down in bed, IV had been dislodged. Cleaned patient and room up, patient wanted to brush teeth. Nurse allowed patient to go to sink and brush teeth. Patient dropped cap for toothbrush twice in the same minute. He insisted on picking it up causing him to kneel back down on his hands and knees even after I told him that I would get the cap... Patient got cap, finished oral care and got back in bed. NIH is still zero. No c/o of dizziness. Patient states he has macular degeneration in L eye and uses a magnifying glass at home. Patient is constantly dropping items and reaching down to retrieve them. Not sure if this correlates with recent infarct or macular degeneration but wanted to make note of this event. As I see where this could contribute to more falls in the future.

## 2020-11-22 ENCOUNTER — Encounter: Payer: Self-pay | Admitting: Internal Medicine

## 2020-11-30 ENCOUNTER — Encounter: Payer: Self-pay | Admitting: Adult Health

## 2020-11-30 ENCOUNTER — Ambulatory Visit (INDEPENDENT_AMBULATORY_CARE_PROVIDER_SITE_OTHER): Payer: BC Managed Care – PPO | Admitting: Adult Health

## 2020-11-30 VITALS — BP 125/76 | HR 74 | Ht 73.0 in | Wt 166.0 lb

## 2020-11-30 DIAGNOSIS — M48061 Spinal stenosis, lumbar region without neurogenic claudication: Secondary | ICD-10-CM

## 2020-11-30 DIAGNOSIS — I10 Essential (primary) hypertension: Secondary | ICD-10-CM | POA: Diagnosis not present

## 2020-11-30 DIAGNOSIS — I639 Cerebral infarction, unspecified: Secondary | ICD-10-CM | POA: Diagnosis not present

## 2020-11-30 DIAGNOSIS — E785 Hyperlipidemia, unspecified: Secondary | ICD-10-CM | POA: Diagnosis not present

## 2020-11-30 NOTE — Progress Notes (Signed)
[facility] [address] [place]. [place] [address] [phone]       HOSPITAL FOLLOW UP NOTE  [name] Date of Birth:  May 23, 1951 Medical Record Number:  [id number]   Reason for Referral:  hospital stroke follow up    SUBJECTIVE:   CHIEF COMPLAINT:  Chief Complaint  Patient presents with  . Hospitalization Follow-up    Tm rm, alone, pt state he is doing well     HPI:   [name] is a 69 y.o. male with history of melanoma, anxiety, and HLD who presented on 11/09/2020 with balance issues and fall.  Personally reviewed hospitalization pertinent progress notes, lab work and imaging with summary provided.  Evaluated by Dr. 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  Recommended DAPT for 3 weeks and aspirin alone.  No prior hx of HTN with BP elevated 150-170s allowing permissive hypertension and long-term BP goal normotensive range. Hx of HLD previously on Crestor but self discontinued 6 months prior with LDL 183 therefore recommended restarting Crestor 40 mg daily.  Tobacco cessation education provided.  EtOH abuse with cessation counseling provided.  Other active problems include recent steroid injections and hyponatremia.  Evaluated by therapies and discharged home in stable condition without therapy needs.  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  Vascular risk factors of hyperlipidemia, borderline hypertension, silent lacunar strokes and smoking  CT head No acute stroke. Small vessel disease. Old R thalamic and R caudate body lacunes. ASPECTS 10.     CTA head & neck no LVO. Mild atherosclerosis B ICA bifurcations.   CT perfusion Unremarkable   MRI  L PLIC infarct. Small vessel disease. Atrophy. Old R thalamic and R centrum semiovale lacunes  2D Echo pending  LDL  183  HgbA1c 5.5   Lovenox 40 mg sq daily for VTE prophylaxis  No antithrombotic prior to admission, now on aspirin 81 mg daily. Add plavix 75. Continue DAPT x 3 weeks then aspirin alone.    Therapy recommendations: no therapy needs  Disposition:  Return home  Today, 11/30/2020, [name] is being seen for hospital follow-up unaccompanied.  Doing well since discharge without residual deficits and denies new stroke/TIA symptoms. He continues to have back issues and being [facility] Dr. Nelva Bush (pain) and Dr. Rolena Infante and may need surgical procedure.  He speaks of possibly second opinion with neurosurgery to ensure surgical procedure is his only option at this point.  Remains on DAPT with completing Plavix tomorrow and continuing on aspirin alone without bleeding or bruising.  Continues on Crestor 40 mg daily without myalgias.  Blood pressure today satisfactory at 125/76.  Prior tobacco use socially with complete abstinence since discharge.  No further concerns at this time.     ROS:   14 system review of systems performed and negative with exception of back pain  PMH:  Past Medical History:  Diagnosis Date  . ANXIETY 01/19/2008  . Cervicalgia 01/19/2008  . DISORDER OF BONE AND CARTILAGE UNSPECIFIED 01/11/2008  . HYPERLIPIDEMIA 01/19/2008  . Lumbar spinal stenosis 11/08/2020  . Melanoma ([place]) 09/09/2015  . 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 PSA, INCREASED 07/12/2010  . RASH-NONVESICULAR 01/19/2008    PSH:  Past Surgical History:  Procedure Laterality Date  . CATARACT EXTRACTION    . lumbar disease  Social History:  Social History   Socioeconomic History  . Marital status: Single    Spouse name: Not on file  . Number of children: 2  . Years of education: Not on file  . Highest education level: Not on file  Occupational History  . Occupation: Pharmacist, [employer]  Tobacco Use  . Smoking status: Current Some Day Smoker  . Smokeless tobacco: Never Used  Substance and  Sexual Activity  . Alcohol use: Yes    Alcohol/week: 10.0 standard drinks    Types: 10 Standard drinks or equivalent per week  . Drug use: Not on file  . Sexual activity: Not Currently  Other Topics Concern  . Not on file  Social History Narrative  . Not on file   Social Determinants of Health   Financial Resource Strain: Not on file  Food Insecurity: Not on file  Transportation Needs: Not on file  Physical Activity: Not on file  Stress: Not on file  Social Connections: Not on file  Intimate Partner Violence: Not on file    Family History:  Family History  Problem Relation Age of Onset  . Lumbar disc disease Father   . Multiple sclerosis Other   . Diabetes Other   . Cancer Other        lung cancer    Medications:   Current Outpatient Medications on File Prior to Visit  Medication Sig Dispense Refill  . acetaminophen (TYLENOL) 500 MG tablet Take 1,000 mg by mouth every 6 (six) hours as needed for mild pain.    Marland Kitchen amLODipine (NORVASC) 5 MG tablet Take 1 tablet (5 mg total) by mouth daily. 30 tablet 11  . aspirin EC 81 MG EC tablet Take 1 tablet (81 mg total) by mouth daily. Swallow whole. 30 tablet 11  . clopidogrel (PLAVIX) 75 MG tablet Take 1 tablet (75 mg total) by mouth daily. 21 tablet 0  . Influenza vac split quadrivalent PF (FLUZONE HIGH-DOSE) 0.5 ML injection Fluzone High-Dose 2018-2019 (PF) 180 mcg/0.5 mL intramuscular syringe  ADM 0.5ML IM UTD    . pregabalin (LYRICA) 75 MG capsule Take 75 mg by mouth 2 (two) times daily.     . rosuvastatin (CRESTOR) 40 MG tablet Take 0.5 tablets (20 mg total) by mouth daily. 45 tablet 11  . sildenafil (VIAGRA) 100 MG tablet TAKE 1 TABLET BY MOUTH EVERY OTHER DAY AS NEEDED (Patient taking differently: Take 100 mg by mouth as needed for erectile dysfunction.) 10 tablet 11   No current facility-administered medications on file prior to visit.    Allergies:   Allergies  Allergen Reactions  . Penicillins Other (See Comments)     Childhood allergy      OBJECTIVE:  Physical Exam  Vitals:   11/30/20 0911  BP: 125/76  Pulse: 74  Weight: 166 lb (75.3 kg)  Height: 6\' 1"  (1.854 m)   Body mass index is 21.9 kg/m. No exam data present  Post stroke PHQ 2/9 Depression screen PHQ 2/9 11/08/2020  Decreased Interest 0  Down, Depressed, Hopeless 0  PHQ - 2 Score 0     General: well developed, well nourished,  pleasant middle-age Caucasian male, seated, in no evident distress Head: head normocephalic and atraumatic.   Neck: supple with no carotid or supraclavicular bruits Cardiovascular: regular rate and rhythm, no murmurs Musculoskeletal: no deformity Skin:  no rash/petichiae Vascular:  Normal pulses all extremities   Neurologic Exam Mental Status: Awake and fully alert.   Fluent speech and language.  Oriented to place and time. Recent and remote memory intact. Attention span, concentration and fund of knowledge appropriate. Mood and affect appropriate.  Cranial Nerves: Fundoscopic exam reveals sharp disc margins. Pupils equal, briskly reactive to light. Extraocular movements full without nystagmus. Visual fields full to confrontation. Hearing intact. Facial sensation intact. Face, tongue, palate moves normally and symmetrically.  Motor: Normal bulk and tone. Normal strength in all tested extremity muscles.   Dlt Bic Tri FgS Grp HF  KnF KnE PIF DoF  R 5 5 5 5 5 5 5 5 5 5   L 5 5 5 5 5 5 5 5 5 5   Sensory.: intact to touch , pinprick , position and vibratory sensation.  Coordination: Rapid alternating movements normal in all extremities. Finger-to-nose and heel-to-shin performed accurately bilaterally. Gait and Station: Arises from chair without difficulty. Stance is normal. Gait demonstrates normal stride length and balance without use of assistive device. Reflexes: 1+ and symmetric. Toes downgoing.     NIHSS  0 Modified Rankin  0      ASSESSMENT: Gerald Jenkins is a 69 y.o. 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 Vascular risk factors include HLD, borderline HTN, silent lacunar strokes, tobacco use and EtOH use.      PLAN:  1. L PLIC stroke :  a. Recovered well without residual deficits.   b. 3 weeks DAPT completed tomorrow with plans on discontinuing Plavix and will continue aspirin 81 mg daily alone and continue Crestor 40 mg daily for secondary stroke prevention.   c. Discussed secondary stroke prevention measures and importance of close PCP follow up for aggressive stroke risk factor management  2. HTN: BP goal <130/90.  Stable on amlodipine per PCP 3. HLD: LDL goal <70. Recent LDL 183.  Restarted Crestor during recent stroke admission.  Previously self discontinued 6 months prior due to possible cause of back pain.  Advised to continue and follow-up with PCP in the next 1 to 2 months for repeat lipid panel and ongoing prescribing of Crestor 4. Spinal stenosis: Currently being followed by [facility].  Patient interested in second opinion with neurosurgery to ensure surgical procedure is his only further option.  He will call office if referral to neurosurgery is needed.  Discussed awaiting at least 3 to 6 months post stroke for any elective procedure    Follow up in 6 months or call earlier if needed   CC:  [facility] provider: Dr. Janene Harvey, Hunt Oris, MD    I spent 45 minutes of face-to-face and non-face-to-face time with patient.  This included previsit chart review including hospitalization pertinent progress notes, lab work and imaging, lab review, study review, order entry, electronic health record documentation, patient education regarding recent stroke including etiology, spinal stenosis, importance of managing stroke risk factors and answered all other questions to patient satisfaction   Frann Rider, AGNP-BC  [facility] [address] [place], [place] [address]  Phone [phone] Fax [phone] Note: This document was prepared with digital dictation and possible smart phrase technology. Any transcriptional errors that result from this process are unintentional.

## 2020-11-30 NOTE — Patient Instructions (Signed)
Please let me know if you would like a referral to neurosurgery for second opinion regarding your ongoing back pain and possible need of surgical procedure   Continue aspirin 81 mg daily  and Crestor for secondary stroke prevention Complete plavix tomorrow  Continue to follow up with PCP regarding cholesterol and blood pressure management  Maintain strict control of hypertension with blood pressure goal below 130/90 and cholesterol with LDL cholesterol (bad cholesterol) goal below 70 mg/dL.      Followup in the future with me in 6 months or call earlier if needed      Thank you for coming to see [place] at [facility]. I hope we have been able to provide you high quality care today.  You may receive a patient satisfaction survey over the next few weeks. We would appreciate your feedback and comments so that we may continue to improve ourselves and the health of our patients.

## 2020-12-04 ENCOUNTER — Encounter: Payer: Self-pay | Admitting: Internal Medicine

## 2020-12-04 DIAGNOSIS — M5416 Radiculopathy, lumbar region: Secondary | ICD-10-CM

## 2020-12-05 NOTE — Progress Notes (Signed)
I agree with the above plan 

## 2021-01-03 ENCOUNTER — Other Ambulatory Visit: Payer: Self-pay | Admitting: Neurosurgery

## 2021-01-08 ENCOUNTER — Encounter: Payer: Self-pay | Admitting: Adult Health

## 2021-01-16 HISTORY — PX: SPINAL FUSION: SHX223

## 2021-01-19 ENCOUNTER — Other Ambulatory Visit: Payer: Self-pay

## 2021-01-19 ENCOUNTER — Encounter (HOSPITAL_COMMUNITY): Payer: Self-pay

## 2021-01-19 ENCOUNTER — Encounter (HOSPITAL_COMMUNITY)
Admission: RE | Admit: 2021-01-19 | Discharge: 2021-01-19 | Disposition: A | Discharge status: Home / Self Care | Payer: BC Managed Care – PPO | Source: Ambulatory Visit | Attending: Neurosurgery | Admitting: Neurosurgery

## 2021-01-19 DIAGNOSIS — Z87891 Personal history of nicotine dependence: Secondary | ICD-10-CM | POA: Insufficient documentation

## 2021-01-19 DIAGNOSIS — I10 Essential (primary) hypertension: Secondary | ICD-10-CM | POA: Diagnosis not present

## 2021-01-19 DIAGNOSIS — M7130 Other bursal cyst, unspecified site: Secondary | ICD-10-CM | POA: Insufficient documentation

## 2021-01-19 DIAGNOSIS — Z01812 Encounter for preprocedural laboratory examination: Secondary | ICD-10-CM | POA: Insufficient documentation

## 2021-01-19 DIAGNOSIS — Z7982 Long term (current) use of aspirin: Secondary | ICD-10-CM | POA: Diagnosis not present

## 2021-01-19 DIAGNOSIS — Z7901 Long term (current) use of anticoagulants: Secondary | ICD-10-CM | POA: Diagnosis not present

## 2021-01-19 DIAGNOSIS — Z8673 Personal history of transient ischemic attack (TIA), and cerebral infarction without residual deficits: Secondary | ICD-10-CM | POA: Insufficient documentation

## 2021-01-19 DIAGNOSIS — E785 Hyperlipidemia, unspecified: Secondary | ICD-10-CM | POA: Insufficient documentation

## 2021-01-19 DIAGNOSIS — Z79899 Other long term (current) drug therapy: Secondary | ICD-10-CM | POA: Insufficient documentation

## 2021-01-19 HISTORY — DX: Unspecified optic neuritis: H46.9

## 2021-01-19 HISTORY — DX: Essential (primary) hypertension: I10

## 2021-01-19 HISTORY — DX: Cardiac murmur, unspecified: R01.1

## 2021-01-19 LAB — BASIC METABOLIC PANEL
Anion gap: 10 (ref 5–15)
BUN: 18 mg/dL (ref 8–23)
CO2: 25 mmol/L (ref 22–32)
Calcium: 9.5 mg/dL (ref 8.9–10.3)
Chloride: 99 mmol/L (ref 98–111)
Creatinine, Ser: 0.98 mg/dL (ref 0.61–1.24)
GFR, Estimated: >60 mL/min (ref >60)
Glucose, Bld: 91 mg/dL (ref 70–99)
Potassium: 5.1 mmol/L (ref 3.5–5.1)
Sodium: 134 mmol/L — ABNORMAL LOW (ref 135–145)

## 2021-01-19 LAB — CBC WITH DIFFERENTIAL/PLATELET
Abs Immature Granulocytes: 0.02 10*3/uL (ref 0.00–0.07)
Basophils Absolute: 0.1 10*3/uL (ref 0.0–0.1)
Basophils Relative: 1 %
Eosinophils Absolute: 0.2 10*3/uL (ref 0.0–0.5)
Eosinophils Relative: 2 %
HCT: 39.3 % (ref 39.0–52.0)
Hemoglobin: 13.1 g/dL (ref 13.0–17.0)
Immature Granulocytes: 0 %
Lymphocytes Relative: 25 %
Lymphs Abs: 1.8 10*3/uL (ref 0.7–4.0)
MCH: 30.6 pg (ref 26.0–34.0)
MCHC: 33.3 g/dL (ref 30.0–36.0)
MCV: 91.8 fL (ref 80.0–100.0)
Monocytes Absolute: 0.7 10*3/uL (ref 0.1–1.0)
Monocytes Relative: 10 %
Neutro Abs: 4.3 10*3/uL (ref 1.7–7.7)
Neutrophils Relative %: 62 %
Platelets: 295 10*3/uL (ref 150–400)
RBC: 4.28 MIL/uL (ref 4.22–5.81)
RDW: 13 % (ref 11.5–15.5)
WBC: 7 10*3/uL (ref 4.0–10.5)
nRBC: 0 % (ref 0.0–0.2)

## 2021-01-19 LAB — SURGICAL PCR SCREEN
MRSA, PCR: NEGATIVE
Staphylococcus aureus: NEGATIVE

## 2021-01-19 LAB — TYPE AND SCREEN
ABO/RH(D): A POS
Antibody Screen: NEGATIVE

## 2021-01-19 NOTE — Progress Notes (Signed)
PCP: Dr. Cathlean Cower Cardiologist: Denies  EKG: 11-10-20 CXR: n/a ECHO: 11-10-20 Stress Test: n/a Cardiac Cath: n/a  Going for Covid test 01/22/21  Pt with recent stroke Nov 2021, stopped Plavix at 3 week follow-up in Dec 2021.  Called James PA-C in anesthesia, no need to see at PAT appt, but will forward chart for review.   Patient denies shortness of breath, fever, cough, and chest pain at PAT appointment.  Patient verbalized understanding of instructions provided today at the PAT appointment.  Patient asked to review instructions at home and day of surgery.

## 2021-01-19 NOTE — Progress Notes (Signed)
Surgical Instructions    Your procedure is scheduled on January 23, 2021.  Report to [facility] Main Entrance "A" at 9:15 A.M., then check in with the Admitting office.  Call this number if you have problems the morning of surgery:  [phone]   If you have any questions prior to your surgery date call [phone]: Open Monday-Friday 8am-4pm    Remember:  Do not eat or drink after midnight the night before your surgery    Take these medicines the morning of surgery with A SIP OF WATER : Amlodipine (Norvasc) Pregabalin (Lyrica) Rosuvastatin (Crestor)  If needed: Acetaminophen (Tylenol)  Follow your Doctor's instructions regarding when to STOP/HOLD your clopidogrel (PLAVIX) and Aspirin.  If no instructions were provided, call your Doctor.  As of today, STOP taking any Aspirin (unless otherwise instructed by your surgeon) Aleve, Naproxen, Ibuprofen, Motrin, Advil, Goody's, BC's, all herbal medications, fish oil, and all vitamins.                     Do not wear jewelry, make up, or nail polish            Do not wear lotions, powders, perfumes/colognes, or deodorant.            Do not shave 48 hours prior to surgery.  Men may shave face and neck.            Do not bring valuables to the hospital.            [facility] is not responsible for any belongings or valuables.  Do NOT Smoke (Tobacco/Vaping) or drink Alcohol 24 hours prior to your procedure If you use a CPAP at night, you may bring all equipment for your overnight stay.   Contacts, glasses, dentures or bridgework may not be worn into surgery, please bring cases for these belongings   For patients admitted to the hospital, discharge time will be determined by your treatment team.   Patients discharged the day of surgery will not be allowed to drive home, and someone needs to stay with them for 24 hours.    Special instructions:   [facility]- Preparing For Surgery  Before surgery, you can play an important role.  Because skin is not sterile, your skin needs to be as free of germs as possible. You can reduce the number of germs on your skin by washing with CHG (chlorahexidine gluconate) Soap before surgery.  CHG is an antiseptic cleaner which kills germs and bonds with the skin to continue killing germs even after washing.    Oral Hygiene is also important to reduce your risk of infection.  Remember - BRUSH YOUR TEETH THE MORNING OF SURGERY WITH YOUR REGULAR TOOTHPASTE  Please do not use if you have an allergy to CHG or antibacterial soaps. If your skin becomes reddened/irritated stop using the CHG.  Do not shave (including legs and underarms) for at least 48 hours prior to first CHG shower. It is OK to shave your face.  Please follow these instructions carefully.   1. If you chose to wash your hair, wash your hair first as usual with your normal shampoo.  2. After you shampoo, rinse your hair and body thoroughly to remove the shampoo.  3. Wash Face and genitals (private parts) with your normal soap.   4. THEN Shower the NIGHT BEFORE SURGERY and the MORNING OF SURGERY with CHG Soap.   5. Use CHG as you would any other liquid soap. You  can apply CHG directly to the skin and wash gently with a scrungie or a clean washcloth.   6. Apply the CHG Soap to your body ONLY FROM THE NECK DOWN.  Do not use on open wounds or open sores. Avoid contact with your eyes, ears, mouth and genitals (private parts). Wash Face and genitals (private parts)  with your normal soap.   7. Wash thoroughly, paying special attention to the area where your surgery will be performed.  8. Thoroughly rinse your body with warm water from the neck down.  9. DO NOT shower/wash with your normal soap after using and rinsing off the CHG Soap.  10. Pat yourself dry with a CLEAN TOWEL.  11. Wear CLEAN PAJAMAS to bed the night before surgery  12. Place CLEAN SHEETS on your bed the night before your surgery  13. DO NOT SLEEP WITH  PETS.   Day of Surgery: Wear Clean/Comfortable clothing the morning of surgery Do not apply any deodorants/lotions.   Remember to brush your teeth WITH YOUR REGULAR TOOTHPASTE.   Please read over the following fact sheets that you were given.

## 2021-01-22 ENCOUNTER — Telehealth: Payer: Self-pay | Admitting: *Deleted

## 2021-01-22 ENCOUNTER — Other Ambulatory Visit (HOSPITAL_COMMUNITY)
Admission: RE | Admit: 2021-01-22 | Discharge: 2021-01-22 | Disposition: A | Discharge status: Home / Self Care | Payer: BC Managed Care – PPO | Source: Ambulatory Visit | Attending: Neurosurgery | Admitting: Neurosurgery

## 2021-01-22 DIAGNOSIS — Z01812 Encounter for preprocedural laboratory examination: Secondary | ICD-10-CM | POA: Diagnosis present

## 2021-01-22 DIAGNOSIS — Z20822 Contact with and (suspected) exposure to covid-19: Secondary | ICD-10-CM | POA: Diagnosis not present

## 2021-01-22 LAB — SARS CORONAVIRUS 2 (TAT 6-24 HRS): SARS Coronavirus 2: NEGATIVE

## 2021-01-22 NOTE — Progress Notes (Signed)
Anesthesia Chart Review:  Case: [id number] Date/Time: 01/23/21 1058   Procedure: PLIF - L4-L5 (N/A Back)   Anesthesia type: General   Pre-op diagnosis: Synovial cyst   Location: [facility] OR ROOM 87 / [place] OR   Surgeons: Earnie Larsson, MD      DISCUSSION: Patient is a 70 year old male scheduled for the above procedure.   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   - Admission 11/09/20-11/10/20 with balance issues, speech changes and facial droop upon awakening. He had fallen the evening prior while cleaning the house. Symptoms completely resolved. No tPA given. CTA head and neck without acute findings, but MRI brain showed subtle acute/subacute nonhemorrhagic infarct involving the posterior limb of the left internal capsule, remote lacunar infarcts of the right thalamus and right centrum semiovale, and atrophy and white matter disease likely reflecting the sequela of chronic microvascular ischemia. EKG showed SR and echo was unremarkable. Neurologist Dr. Leonie Man was consulted. Plavix 75 mg + ASA 81 mg x 3 weeks recommended then ASA alone. Patient agreeable to quit smoking. No PT/OT/ST follow-up warranted. Amlodipine added for HTN. Resume Crestor (he had stopped 7 months prior). Out-patient neurology follow-up.   - Last neurology follow-up was on 11/30/20 with Frann Rider, NP. He was doing well from a CVA standpoint but was having some back issues and was undergoing evaluation at [facility] and may require surgery. He wanted a second opinion, and his primary referred him to Dr. Annette Stable. Initially neurology discussed waiting for 3-6 months post CVA for elective surgery; however, patient has since communicated with Frann Rider, NP that after evaluation with Dr. Annette Stable including discussion about  his 10/2020 CVA that 01/23/21 surgery was recommended (see patient message 01/08/21). Lorriane Shire at Dr. Marchelle Folks office reaching out to neurology and to Dr. Annette Stable to clarify communication--I notified patient of this given CVA < 90 days ago. He remains without recurrent CVA symptoms. ASA on hold since 01/18/21. He has completed course of Plavix.  01/22/21 presurgical COVID-19 test in process.   VS: BP 135/86   Pulse 94   Temp 36.7 C   Resp 18   Ht 6\' 1"  (1.854 m)   Wt 75.6 kg   SpO2 99%   BMI 21.98 kg/m     PROVIDERS: Biagio Borg, MD is PCP  Antony Contras, MD is neurologist   LABS: Labs reviewed: Acceptable for surgery. (all labs ordered are listed, but only abnormal results are displayed)  Labs Reviewed  BASIC METABOLIC PANEL - Abnormal; Notable for the following components:      Result Value   Sodium 134 (*)    All other components within normal limits  SURGICAL PCR SCREEN  CBC WITH DIFFERENTIAL/PLATELET  TYPE AND SCREEN     IMAGES: MRI Brain 11/09/20: IMPRESSION: 1. Subtle acute/subacute nonhemorrhagic infarct involving the posterior limb of the left internal capsule. This corresponds with right-sided weakness. 2. Atrophy and white matter disease likely reflects the sequela of chronic microvascular ischemia. 3. Remote lacunar infarcts of the right thalamus and right centrum semi ovale.  CT head and CTA head/neck 11/09/20: IMPRESSION: 1. No acute finding. Chronic small-vessel ischemic changes of the white matter. 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 2. No large or medium vessel occlusion. 3. Mild nonstenotic atherosclerotic disease at both carotid bifurcations. 4. Normal  perfusion study.  MRI L-spine 12/07/19: IMPRESSION: 1. Moderate to severe multifactorial spinal stenosis at L4-5. A synovial cyst projecting medially from the right facet joint contributes to mass effect on the thecal sac and probable right L5 nerve root encroachment.  There is underlying narrowing of the left lateral recess and both foramina. 2. Mild multifactorial spinal stenosis at L2-3 with mild narrowing of the lateral recesses and foramina bilaterally. 3. Shallow right paracentral disc protrusion at L5-S1 without definite nerve root encroachment. 4. Congenitally short pedicles.   EKG: 11/09/20: Sinus rhythm Biatrial enlargement Borderline ST depression, diffuse leads No old tracing to compare Confirmed by Pattricia Boss [phone]) on 11/09/2020 1:11:58 PM   CV: Echo 11/10/20: IMPRESSIONS  1. Left ventricular ejection fraction, by estimation, is 60 to 65%. The  left ventricle has normal function. The left ventricle has no regional  wall motion abnormalities. Left ventricular diastolic parameters were  normal.  2. Right ventricular systolic function is normal. The right ventricular  size is normal.  3. The mitral valve is normal in structure. Trivial mitral valve  regurgitation. No evidence of mitral stenosis.  4. The aortic valve is normal in structure. Aortic valve regurgitation is  not visualized. No aortic stenosis is present.  5. The inferior vena cava is normal in size with greater than 50%  respiratory variability, suggesting right atrial pressure of 3 mmHg.    Past Medical History:  Diagnosis Date  . ANXIETY 01/19/2008  . Cervicalgia 01/19/2008  . DISORDER OF BONE AND CARTILAGE UNSPECIFIED 01/11/2008  . Heart murmur    when you were born, states he outgrew  . HYPERLIPIDEMIA 01/19/2008  . Hypertension   . Lumbar spinal stenosis 11/08/2020  . Melanoma ([place]) 09/09/2015  . Optic neuropathy   . 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 PSA, INCREASED 07/12/2010  . RASH-NONVESICULAR 01/19/2008  . Stroke [facility]) 10/2020    Past Surgical History:  Procedure Laterality Date  . CATARACT EXTRACTION    . HERNIA REPAIR    . lumbar disease    . ulner nerve Left     MEDICATIONS: . acetaminophen (TYLENOL) 500 MG tablet  . amLODipine (NORVASC)  5 MG tablet  . aspirin EC 81 MG EC tablet  . clopidogrel (PLAVIX) 75 MG tablet  . pregabalin (LYRICA) 75 MG capsule  . rosuvastatin (CRESTOR) 40 MG tablet  . sildenafil (VIAGRA) 100 MG tablet   No current facility-administered medications for this encounter.    Myra Gianotti, PA-C Surgical Short Stay/Anesthesiology [facility] Phone [phone] [facility] Phone [phone] 01/22/2021 2:18 PM

## 2021-01-22 NOTE — Anesthesia Preprocedure Evaluation (Addendum)
Anesthesia Evaluation  Patient identified by MRN, date of birth, ID band Patient awake    Reviewed: Allergy & Precautions, NPO status , Patient's Chart, lab work & pertinent test results  Airway Mallampati: II  TM Distance: >3 FB Neck ROM: Full    Dental  (+) Teeth Intact, Dental Advisory Given   Pulmonary former smoker,    Pulmonary exam normal breath sounds clear to auscultation       Cardiovascular hypertension, Pt. on medications Normal cardiovascular exam Rhythm:Regular Rate:Normal  Echo 11/10/20: IMPRESSIONS  1. Left ventricular ejection fraction, by estimation, is 60 to 65%. The  left ventricle has normal function. The left ventricle has no regional  wall motion abnormalities. Left ventricular diastolic parameters were  normal.  2. Right ventricular systolic function is normal. The right ventricular  size is normal.  3. The mitral valve is normal in structure. Trivial mitral valve  regurgitation. No evidence of mitral stenosis.  4. The aortic valve is normal in structure. Aortic valve regurgitation is  not visualized. No aortic stenosis is present.  5. The inferior vena cava is normal in size with greater than 50%  respiratory variability, suggesting right atrial pressure of 3 mmHg.    Neuro/Psych PSYCHIATRIC DISORDERS Anxiety Synovial cyst Optic neuropathy Cervicalgia CVA, No Residual Symptoms    GI/Hepatic negative GI ROS, Neg liver ROS,   Endo/Other  negative endocrine ROS  Renal/GU negative Renal ROS     Musculoskeletal  (+) Arthritis ,   Abdominal   Peds  Hematology  (+) Blood dyscrasia (Plavix), ,   Anesthesia Other Findings Day of surgery medications reviewed with the patient.  Reproductive/Obstetrics                           Anesthesia Physical Anesthesia Plan  ASA: III  Anesthesia Plan: General   Post-op Pain Management:    Induction: Intravenous  PONV  Risk Score and Plan: 2 and Midazolam, Dexamethasone and Ondansetron  Airway Management Planned: Oral ETT  Additional Equipment:   Intra-op Plan:   Post-operative Plan: Extubation in OR  Informed Consent: I have reviewed the patients History and Physical, chart, labs and discussed the procedure including the risks, benefits and alternatives for the proposed anesthesia with the patient or authorized representative who has indicated his/her understanding and acceptance.     Dental advisory given  Plan Discussed with: CRNA  Anesthesia Plan Comments: (See PAT note written 01/22/2021 by Myra Gianotti, PA-C. )      Anesthesia Quick Evaluation

## 2021-01-22 NOTE — Telephone Encounter (Signed)
Clearance received by fax. To JM/NP for signature.

## 2021-01-23 ENCOUNTER — Observation Stay (HOSPITAL_COMMUNITY)
Admission: RE | Admit: 2021-01-23 | Discharge: 2021-01-24 | Disposition: A | Discharge status: Home / Self Care | Payer: BC Managed Care – PPO | Attending: Neurosurgery | Admitting: Neurosurgery

## 2021-01-23 ENCOUNTER — Ambulatory Visit (HOSPITAL_COMMUNITY): Payer: BC Managed Care – PPO

## 2021-01-23 ENCOUNTER — Ambulatory Visit (HOSPITAL_COMMUNITY): Payer: BC Managed Care – PPO | Admitting: Physician Assistant

## 2021-01-23 ENCOUNTER — Encounter (HOSPITAL_COMMUNITY)
Admission: RE | Disposition: A | Discharge status: Home / Self Care | Payer: Self-pay | Source: Home / Self Care | Attending: Neurosurgery

## 2021-01-23 ENCOUNTER — Ambulatory Visit (HOSPITAL_COMMUNITY): Payer: BC Managed Care – PPO | Admitting: Anesthesiology

## 2021-01-23 ENCOUNTER — Other Ambulatory Visit: Payer: Self-pay

## 2021-01-23 ENCOUNTER — Encounter (HOSPITAL_COMMUNITY): Payer: Self-pay | Admitting: Neurosurgery

## 2021-01-23 DIAGNOSIS — E785 Hyperlipidemia, unspecified: Secondary | ICD-10-CM | POA: Diagnosis not present

## 2021-01-23 DIAGNOSIS — M4316 Spondylolisthesis, lumbar region: Secondary | ICD-10-CM | POA: Diagnosis not present

## 2021-01-23 DIAGNOSIS — M545 Low back pain, unspecified: Secondary | ICD-10-CM | POA: Diagnosis present

## 2021-01-23 DIAGNOSIS — M7138 Other bursal cyst, other site: Secondary | ICD-10-CM | POA: Insufficient documentation

## 2021-01-23 DIAGNOSIS — Z791 Long term (current) use of non-steroidal anti-inflammatories (NSAID): Secondary | ICD-10-CM | POA: Insufficient documentation

## 2021-01-23 DIAGNOSIS — Z419 Encounter for procedure for purposes other than remedying health state, unspecified: Secondary | ICD-10-CM

## 2021-01-23 DIAGNOSIS — Z79899 Other long term (current) drug therapy: Secondary | ICD-10-CM | POA: Diagnosis not present

## 2021-01-23 DIAGNOSIS — I10 Essential (primary) hypertension: Secondary | ICD-10-CM | POA: Diagnosis not present

## 2021-01-23 LAB — ABO/RH: ABO/RH(D): A POS

## 2021-01-23 SURGERY — POSTERIOR LUMBAR FUSION 1 LEVEL
Anesthesia: General | Site: Back

## 2021-01-23 MED ORDER — CHLORHEXIDINE GLUCONATE 0.12 % MT SOLN
15.0000 mL | Freq: Once | OROMUCOSAL | Status: AC
  Administered 2021-01-23: 15 mL via OROMUCOSAL
  Filled 2021-01-23: qty 15

## 2021-01-23 MED ORDER — MIDAZOLAM HCL 2 MG/2ML IJ SOLN
INTRAMUSCULAR | Status: AC
  Filled 2021-01-23: qty 2

## 2021-01-23 MED ORDER — FENTANYL CITRATE (PF) 100 MCG/2ML IJ SOLN
INTRAMUSCULAR | Status: DC | PRN
  Administered 2021-01-23 (×2): 50 ug via INTRAVENOUS
  Administered 2021-01-23: 100 ug via INTRAVENOUS

## 2021-01-23 MED ORDER — VANCOMYCIN HCL IN DEXTROSE 1-5 GM/200ML-% IV SOLN
1000.0000 mg | INTRAVENOUS | Status: AC
  Administered 2021-01-23: 1000 mg via INTRAVENOUS
  Filled 2021-01-23: qty 200

## 2021-01-23 MED ORDER — VANCOMYCIN HCL 1000 MG IV SOLR
INTRAVENOUS | Status: AC
  Filled 2021-01-23: qty 1000

## 2021-01-23 MED ORDER — LIDOCAINE 2% (20 MG/ML) 5 ML SYRINGE
INTRAMUSCULAR | Status: DC | PRN
  Administered 2021-01-23: 80 mg via INTRAVENOUS

## 2021-01-23 MED ORDER — ARTIFICIAL TEARS OPHTHALMIC OINT
TOPICAL_OINTMENT | OPHTHALMIC | Status: DC | PRN
  Administered 2021-01-23: 1 via OPHTHALMIC

## 2021-01-23 MED ORDER — THROMBIN 20000 UNITS EX SOLR
CUTANEOUS | Status: AC
  Filled 2021-01-23: qty 20000

## 2021-01-23 MED ORDER — MENTHOL 3 MG MT LOZG: 1.0000 | LOZENGE | OROMUCOSAL | Status: DC | PRN

## 2021-01-23 MED ORDER — PROPOFOL 10 MG/ML IV BOLUS
INTRAVENOUS | Status: DC | PRN
  Administered 2021-01-23: 140 mg via INTRAVENOUS

## 2021-01-23 MED ORDER — SODIUM CHLORIDE 0.9 % IV SOLN: 250.0000 mL | INTRAVENOUS | Status: DC

## 2021-01-23 MED ORDER — DEXAMETHASONE SODIUM PHOSPHATE 10 MG/ML IJ SOLN
10.0000 mg | Freq: Once | INTRAMUSCULAR | Status: AC
  Administered 2021-01-23: 10 mg via INTRAVENOUS
  Filled 2021-01-23: qty 1

## 2021-01-23 MED ORDER — DEXMEDETOMIDINE (PRECEDEX) IN NS 20 MCG/5ML (4 MCG/ML) IV SYRINGE
PREFILLED_SYRINGE | INTRAVENOUS | Status: DC | PRN
  Administered 2021-01-23 (×2): 4 ug via INTRAVENOUS

## 2021-01-23 MED ORDER — SUGAMMADEX SODIUM 200 MG/2ML IV SOLN
INTRAVENOUS | Status: DC | PRN
  Administered 2021-01-23: 200 mg via INTRAVENOUS

## 2021-01-23 MED ORDER — OXYCODONE HCL 5 MG PO TABS
10.0000 mg | ORAL_TABLET | ORAL | Status: DC | PRN
  Administered 2021-01-23: 10 mg via ORAL
  Administered 2021-01-24 (×2): 5 mg via ORAL
  Filled 2021-01-23 (×3): qty 2

## 2021-01-23 MED ORDER — DIAZEPAM 5 MG PO TABS: 5.0000 mg | ORAL_TABLET | Freq: Four times a day (QID) | ORAL | Status: DC | PRN

## 2021-01-23 MED ORDER — THROMBIN 20000 UNITS EX SOLR
CUTANEOUS | Status: DC | PRN
  Administered 2021-01-23: 20 mL via TOPICAL

## 2021-01-23 MED ORDER — ONDANSETRON HCL 4 MG PO TABS: 4.0000 mg | ORAL_TABLET | Freq: Four times a day (QID) | ORAL | Status: DC | PRN

## 2021-01-23 MED ORDER — ROCURONIUM BROMIDE 10 MG/ML (PF) SYRINGE
PREFILLED_SYRINGE | INTRAVENOUS | Status: AC
  Filled 2021-01-23: qty 10

## 2021-01-23 MED ORDER — CEFAZOLIN SODIUM 1 G IJ SOLR
INTRAMUSCULAR | Status: AC
  Filled 2021-01-23: qty 20

## 2021-01-23 MED ORDER — HYDROMORPHONE HCL 1 MG/ML IJ SOLN
1.0000 mg | INTRAMUSCULAR | Status: DC | PRN
  Administered 2021-01-23: 1 mg via INTRAVENOUS
  Filled 2021-01-23: qty 1

## 2021-01-23 MED ORDER — VANCOMYCIN HCL IN DEXTROSE 750-5 MG/150ML-% IV SOLN
750.0000 mg | Freq: Once | INTRAVENOUS | Status: AC
  Administered 2021-01-23: 750 mg via INTRAVENOUS
  Filled 2021-01-23: qty 150

## 2021-01-23 MED ORDER — DEXMEDETOMIDINE (PRECEDEX) IN NS 20 MCG/5ML (4 MCG/ML) IV SYRINGE
PREFILLED_SYRINGE | INTRAVENOUS | Status: AC
  Filled 2021-01-23: qty 5

## 2021-01-23 MED ORDER — ACETAMINOPHEN 650 MG RE SUPP: 650.0000 mg | RECTAL | Status: DC | PRN

## 2021-01-23 MED ORDER — CHLORHEXIDINE GLUCONATE CLOTH 2 % EX PADS: 6.0000 | MEDICATED_PAD | Freq: Once | CUTANEOUS | Status: DC

## 2021-01-23 MED ORDER — FENTANYL CITRATE (PF) 100 MCG/2ML IJ SOLN
INTRAMUSCULAR | Status: AC
  Filled 2021-01-23: qty 2

## 2021-01-23 MED ORDER — PROMETHAZINE HCL 25 MG/ML IJ SOLN: 6.2500 mg | INTRAMUSCULAR | Status: DC | PRN

## 2021-01-23 MED ORDER — 0.9 % SODIUM CHLORIDE (POUR BTL) OPTIME
TOPICAL | Status: DC | PRN
  Administered 2021-01-23: 1000 mL

## 2021-01-23 MED ORDER — CEFAZOLIN SODIUM-DEXTROSE 2-3 GM-%(50ML) IV SOLR
INTRAVENOUS | Status: DC | PRN
  Administered 2021-01-23: 2 g via INTRAVENOUS

## 2021-01-23 MED ORDER — PHENYLEPHRINE HCL-NACL 10-0.9 MG/250ML-% IV SOLN
INTRAVENOUS | Status: DC | PRN
  Administered 2021-01-23: 25 ug/min via INTRAVENOUS

## 2021-01-23 MED ORDER — BISACODYL 10 MG RE SUPP: 10.0000 mg | Freq: Every day | RECTAL | Status: DC | PRN

## 2021-01-23 MED ORDER — FENTANYL CITRATE (PF) 250 MCG/5ML IJ SOLN
INTRAMUSCULAR | Status: AC
  Filled 2021-01-23: qty 5

## 2021-01-23 MED ORDER — BUPIVACAINE HCL (PF) 0.25 % IJ SOLN
INTRAMUSCULAR | Status: DC | PRN
  Administered 2021-01-23: 20 mL

## 2021-01-23 MED ORDER — ASPIRIN EC 81 MG PO TBEC
81.0000 mg | DELAYED_RELEASE_TABLET | Freq: Every day | ORAL | Status: DC
  Administered 2021-01-23: 81 mg via ORAL
  Filled 2021-01-23: qty 1

## 2021-01-23 MED ORDER — PREGABALIN 75 MG PO CAPS
75.0000 mg | ORAL_CAPSULE | Freq: Two times a day (BID) | ORAL | Status: DC
  Administered 2021-01-23: 75 mg via ORAL
  Filled 2021-01-23: qty 1

## 2021-01-23 MED ORDER — DEXAMETHASONE SODIUM PHOSPHATE 10 MG/ML IJ SOLN
INTRAMUSCULAR | Status: AC
  Filled 2021-01-23: qty 1

## 2021-01-23 MED ORDER — BUPIVACAINE HCL (PF) 0.25 % IJ SOLN
INTRAMUSCULAR | Status: AC
  Filled 2021-01-23: qty 30

## 2021-01-23 MED ORDER — FENTANYL CITRATE (PF) 100 MCG/2ML IJ SOLN
25.0000 ug | INTRAMUSCULAR | Status: DC | PRN
  Administered 2021-01-23 (×2): 50 ug via INTRAVENOUS

## 2021-01-23 MED ORDER — ACETAMINOPHEN 325 MG PO TABS
650.0000 mg | ORAL_TABLET | ORAL | Status: DC | PRN
  Administered 2021-01-23: 650 mg via ORAL
  Filled 2021-01-23: qty 2

## 2021-01-23 MED ORDER — VANCOMYCIN HCL 1000 MG IV SOLR
INTRAVENOUS | Status: DC | PRN
  Administered 2021-01-23: 1000 mg

## 2021-01-23 MED ORDER — FLEET ENEMA 7-19 GM/118ML RE ENEM: 1.0000 | ENEMA | Freq: Once | RECTAL | Status: DC | PRN

## 2021-01-23 MED ORDER — MIDAZOLAM HCL 5 MG/5ML IJ SOLN
INTRAMUSCULAR | Status: DC | PRN
  Administered 2021-01-23: 2 mg via INTRAVENOUS

## 2021-01-23 MED ORDER — ONDANSETRON HCL 4 MG/2ML IJ SOLN
INTRAMUSCULAR | Status: DC | PRN
  Administered 2021-01-23: 4 mg via INTRAVENOUS

## 2021-01-23 MED ORDER — ROSUVASTATIN CALCIUM 20 MG PO TABS: 20.0000 mg | ORAL_TABLET | Freq: Every day | ORAL | Status: DC

## 2021-01-23 MED ORDER — ACETAMINOPHEN 500 MG PO TABS
1000.0000 mg | ORAL_TABLET | Freq: Once | ORAL | Status: AC
  Administered 2021-01-23: 1000 mg via ORAL
  Filled 2021-01-23: qty 2

## 2021-01-23 MED ORDER — LACTATED RINGERS IV SOLN: INTRAVENOUS | Status: DC

## 2021-01-23 MED ORDER — ONDANSETRON HCL 4 MG/2ML IJ SOLN: 4.0000 mg | Freq: Four times a day (QID) | INTRAMUSCULAR | Status: DC | PRN

## 2021-01-23 MED ORDER — ORAL CARE MOUTH RINSE: 15.0000 mL | Freq: Once | OROMUCOSAL | Status: AC

## 2021-01-23 MED ORDER — PROPOFOL 10 MG/ML IV BOLUS
INTRAVENOUS | Status: AC
  Filled 2021-01-23: qty 20

## 2021-01-23 MED ORDER — LIDOCAINE 2% (20 MG/ML) 5 ML SYRINGE
INTRAMUSCULAR | Status: AC
  Filled 2021-01-23: qty 5

## 2021-01-23 MED ORDER — ROCURONIUM BROMIDE 10 MG/ML (PF) SYRINGE
PREFILLED_SYRINGE | INTRAVENOUS | Status: DC | PRN
  Administered 2021-01-23: 20 mg via INTRAVENOUS
  Administered 2021-01-23: 70 mg via INTRAVENOUS
  Administered 2021-01-23: 10 mg via INTRAVENOUS

## 2021-01-23 MED ORDER — ONDANSETRON HCL 4 MG/2ML IJ SOLN
INTRAMUSCULAR | Status: AC
  Filled 2021-01-23: qty 2

## 2021-01-23 MED ORDER — AMLODIPINE BESYLATE 5 MG PO TABS
5.0000 mg | ORAL_TABLET | Freq: Every day | ORAL | Status: DC
  Administered 2021-01-23: 5 mg via ORAL
  Filled 2021-01-23: qty 1

## 2021-01-23 MED ORDER — HYDROCODONE-ACETAMINOPHEN 10-325 MG PO TABS: 1.0000 | ORAL_TABLET | ORAL | Status: DC | PRN

## 2021-01-23 MED ORDER — SODIUM CHLORIDE 0.9% FLUSH
3.0000 mL | Freq: Two times a day (BID) | INTRAVENOUS | Status: DC
  Administered 2021-01-23: 3 mL via INTRAVENOUS

## 2021-01-23 MED ORDER — PHENOL 1.4 % MT LIQD: 1.0000 | OROMUCOSAL | Status: DC | PRN

## 2021-01-23 MED ORDER — POLYETHYLENE GLYCOL 3350 17 G PO PACK: 17.0000 g | PACK | Freq: Every day | ORAL | Status: DC | PRN

## 2021-01-23 MED ORDER — SODIUM CHLORIDE 0.9% FLUSH: 3.0000 mL | INTRAVENOUS | Status: DC | PRN

## 2021-01-23 SURGICAL SUPPLY — 64 items
ADH SKN CLS APL DERMABOND .7 (GAUZE/BANDAGES/DRESSINGS) ×1
APL SKNCLS STERI-STRIP NONHPOA (GAUZE/BANDAGES/DRESSINGS) ×1
BAG DECANTER FOR FLEXI CONT (MISCELLANEOUS) ×3 IMPLANT
BENZOIN TINCTURE PRP APPL 2/3 (GAUZE/BANDAGES/DRESSINGS) ×3 IMPLANT
BLADE CLIPPER SURG (BLADE) IMPLANT
BONE GRAFTON DBF INJECT 3CC (Bone Implant) ×4 IMPLANT
BUR CUTTER 7.0 ROUND (BURR) IMPLANT
BUR MATCHSTICK NEURO 3.0 LAGG (BURR) ×3 IMPLANT
CANISTER SUCT 3000ML PPV (MISCELLANEOUS) ×3 IMPLANT
CAP LCK SPNE (Orthopedic Implant) ×4 IMPLANT
CAP LOCK SPINE RADIUS (Orthopedic Implant) IMPLANT
CAP LOCKING (Orthopedic Implant) ×12 IMPLANT
CARTRIDGE OIL MAESTRO DRILL (MISCELLANEOUS) ×1 IMPLANT
CLOSURE WOUND 1/2 X4 (GAUZE/BANDAGES/DRESSINGS) ×1
CNTNR URN SCR LID CUP LEK RST (MISCELLANEOUS) ×1 IMPLANT
CONT SPEC 4OZ STRL OR WHT (MISCELLANEOUS) ×3
COVER BACK TABLE 60X90IN (DRAPES) ×3 IMPLANT
COVER WAND RF STERILE (DRAPES) ×3 IMPLANT
DECANTER SPIKE VIAL GLASS SM (MISCELLANEOUS) ×3 IMPLANT
DERMABOND ADVANCED (GAUZE/BANDAGES/DRESSINGS) ×2
DERMABOND ADVANCED .7 DNX12 (GAUZE/BANDAGES/DRESSINGS) ×1 IMPLANT
DIFFUSER DRILL AIR PNEUMATIC (MISCELLANEOUS) ×3 IMPLANT
DRAPE C-ARM 42X72 X-RAY (DRAPES) ×6 IMPLANT
DRAPE HALF SHEET 40X57 (DRAPES) IMPLANT
DRAPE LAPAROTOMY 100X72X124 (DRAPES) ×3 IMPLANT
DRAPE SURG 17X23 STRL (DRAPES) ×12 IMPLANT
DRSG OPSITE POSTOP 4X6 (GAUZE/BANDAGES/DRESSINGS) ×3 IMPLANT
DURAPREP 26ML APPLICATOR (WOUND CARE) ×3 IMPLANT
ELECT REM PT RETURN 9FT ADLT (ELECTROSURGICAL) ×3
ELECTRODE REM PT RTRN 9FT ADLT (ELECTROSURGICAL) ×1 IMPLANT
EVACUATOR 1/8 PVC DRAIN (DRAIN) IMPLANT
GAUZE 4X4 16PLY RFD (DISPOSABLE) IMPLANT
GAUZE SPONGE 4X4 12PLY STRL (GAUZE/BANDAGES/DRESSINGS) IMPLANT
GLOVE BIO SURGEON STRL SZ 6.5 (GLOVE) ×6 IMPLANT
GLOVE BIO SURGEONS STRL SZ 6.5 (GLOVE) ×5
GLOVE ECLIPSE 9.0 STRL (GLOVE) ×6 IMPLANT
GLOVE EXAM NITRILE XL STR (GLOVE) IMPLANT
GLOVE SURG UNDER POLY LF SZ6.5 (GLOVE) ×7 IMPLANT
GOWN STRL REUS W/ TWL LRG LVL3 (GOWN DISPOSABLE) IMPLANT
GOWN STRL REUS W/ TWL XL LVL3 (GOWN DISPOSABLE) ×2 IMPLANT
GOWN STRL REUS W/TWL 2XL LVL3 (GOWN DISPOSABLE) IMPLANT
GOWN STRL REUS W/TWL LRG LVL3 (GOWN DISPOSABLE) ×12
GOWN STRL REUS W/TWL XL LVL3 (GOWN DISPOSABLE) ×6
KIT BASIN OR (CUSTOM PROCEDURE TRAY) ×3 IMPLANT
KIT TURNOVER KIT B (KITS) ×3 IMPLANT
MILL MEDIUM DISP (BLADE) ×3 IMPLANT
NEEDLE HYPO 22GX1.5 SAFETY (NEEDLE) ×3 IMPLANT
NS IRRIG 1000ML POUR BTL (IV SOLUTION) ×3 IMPLANT
OIL CARTRIDGE MAESTRO DRILL (MISCELLANEOUS) ×3
PACK LAMINECTOMY NEURO (CUSTOM PROCEDURE TRAY) ×3 IMPLANT
ROD RADIUS 40MM (Neuro Prosthesis/Implant) ×6 IMPLANT
ROD SPNL 40X5.5XNS TI RDS (Neuro Prosthesis/Implant) IMPLANT
SCREW 5.75X45MM (Screw) ×8 IMPLANT
SPACER PL CATALYFT LONG 11 (Spacer) ×4 IMPLANT
SPONGE SURGIFOAM ABS GEL 100 (HEMOSTASIS) ×3 IMPLANT
STRIP CLOSURE SKIN 1/2X4 (GAUZE/BANDAGES/DRESSINGS) ×3 IMPLANT
SUT VIC AB 0 CT1 18XCR BRD8 (SUTURE) ×2 IMPLANT
SUT VIC AB 0 CT1 8-18 (SUTURE) ×3
SUT VIC AB 2-0 CT1 18 (SUTURE) ×3 IMPLANT
SUT VIC AB 3-0 SH 8-18 (SUTURE) ×4 IMPLANT
TOWEL GREEN STERILE (TOWEL DISPOSABLE) ×3 IMPLANT
TOWEL GREEN STERILE FF (TOWEL DISPOSABLE) ×3 IMPLANT
TRAY FOLEY MTR SLVR 16FR STAT (SET/KITS/TRAYS/PACK) ×3 IMPLANT
WATER STERILE IRR 1000ML POUR (IV SOLUTION) ×3 IMPLANT

## 2021-01-23 NOTE — Op Note (Signed)
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  L4-5 posterior lateral arthrodesis utilizing nonsegmental pedicle screw fixation and local autografting.  Surgeon:Jeshawn Melucci A.Averly Ericson, M.D.  Asst. Surgeon: Reinaldo Meeker, NP  Anesthesia: General  Indication: 70 year old male with back and bilateral lower extremity pain failing conservative management.  Work-up demonstrates evidence of an early grade 1 L4 5-year spondylolisthesis with marked facet arthropathy.  Patient with a large bilateral synovial cyst with severe stenosis.  Patient presents now for decompression, resection of synovial cyst, and fusion in hopes of improving his symptoms.  Operative note: After induction of anesthesia, patient position prone onto Wilson frame and properly padded.  Patient's lumbar region prepped and draped sterilely.  Incision made overlying L4-5.  Dissection performed bilaterally.  Retractor placed.  Fluoroscopy used.  Levels confirmed.  Decompressive laminotomy was then performed using [place] [facility] and high-speed drill to remove the inferior two thirds of the lamina of L4 the entire inferior facet of L4 bilaterally the majority the superior facet of L5 bilaterally and the superior rim of the L5 lamina.  Ligament flavum elevated and resected.  There is tightly adherent synovial cyst bilaterally.  Microscope used microdissection spinal canal and the synovial cyst was gradually peeled away from the underlying thecal sac and L5 nerve roots bilaterally.  Gross total resection  was performed the cyst bilaterally.  There was no evidence of injury to thecal sac and nerve roots.  Bilateral discectomies then performed.  The spaces then prepared for interbody fusion.  With distractor patient patient's right side to space cleaned of all soft tissue.  A 11 mm [employer] expandable cage was then packed into place and expanded.  Distractor removed patient's right side.  To space prepared on the right side.  Morselized autograft packed throughout the interspace.  A second cage was then impacted in place and expanded to its full extent.  Pedicles of L4 and L5 were identified using surface landmarks and intraoperative fluoroscopy and superficial bone around the pedicle was then removed using high-speed drill he pedicle was then probed using a pedicle awl each pedicle tract was probed and found to be solid within the bone.  Each pedicle all track was then tapped with a screw tap.  Screw temple was probed and confirmed to be in good position.  5.75 mm radius brand screws from [employer] were placed bilaterally at L4 and L5.  Final images reveal good position of cages and hardware in proper upper level with normal alignment of spine.  Wound is then irrigated one final time.  Demineralized bone fibers were then packed into the expanded cages in interspace.  Short segment titanium rods and placed over the screw heads at L4 and L5.  Locking caps placed over the screws.  Locking caps then engaged with construct under compression.  Transverse processes were decorticated.  Morselized autograft was packed posterior laterally.  Vancomycin powder placed in the deep wound space.  Wounds and closed in layers of Vicryl sutures.  Steri-Strips and sterile dressing were applied.  No apparent complications.  Patient tolerated the procedure well and he returned to recovery room postop.

## 2021-01-23 NOTE — Progress Notes (Signed)
Pharmacy Antibiotic Note  Gerald Jenkins is a 70 y.o. male s/p lumbar surgery. Pharmacy has been consulted for Vanomycin dosing x 1 dose ~12 hrs post-op for surgical prophylaxis.  No drain.    Vancomycin 1gm IV x 1 given pre-op at 10:48 am.  Plan:  Vancomycin 750 mg IV x 1 at 11pm tonight.  No follow up needed.  Pharmacy signing off.  Height: 6\' 1"  (185.4 cm) Weight: 76.2 kg (168 lb) IBW/kg (Calculated) : 79.9  Temp (24hrs), Avg:97.6 F (36.4 C), Min:97.2 F (36.2 C), Max:97.9 F (36.6 C)  Recent Labs  Lab 01/19/21 0857  WBC 7.0  CREATININE 0.98    Estimated Creatinine Clearance: 77.8 mL/min (by C-G formula based on SCr of 0.98 mg/dL).    Allergies  Allergen Reactions  . Penicillins Other (See Comments)    Childhood allergy   Thank you for allowing pharmacy to be a part of this patient's care.  Arty Baumgartner, [place] 01/23/2021 3:17 PM

## 2021-01-23 NOTE — Anesthesia Postprocedure Evaluation (Signed)
Anesthesia Post Note  Patient: Gerald Jenkins  Procedure(s) Performed: Posterior Lumbar Interbody Fusion - Lumbar four-Lumbar five (N/A Back)     Patient location during evaluation: PACU Anesthesia Type: General Level of consciousness: awake and alert Pain management: pain level controlled Vital Signs Assessment: post-procedure vital signs reviewed and stable Respiratory status: spontaneous breathing, nonlabored ventilation, respiratory function stable and patient connected to nasal cannula oxygen Cardiovascular status: blood pressure returned to baseline and stable Postop Assessment: no apparent nausea or vomiting Anesthetic complications: no   No complications documented.  Last Vitals:  Vitals:   01/23/21 1445 01/23/21 1512  BP: (!) 144/81 (!) 158/97  Pulse: 66 77  Resp: 11 18  Temp: (!) 36.2 C 36.5 C  SpO2: 100% 99%    Last Pain:  Vitals:   01/23/21 1512  TempSrc: Oral  PainSc:                  Catalina Gravel

## 2021-01-23 NOTE — H&P (Addendum)
Gerald Jenkins is an 70 y.o. male.   Chief Complaint: Back pain HPI: 70 year old male presents with severe lumbosacral pain with radiation into the posterior aspects of both lower extremities.  Symptoms aggravated by standing or walking.  The symptoms have failed conservative management work-up demonstrates evidence of severe facet arthropathy and degenerative spondylolisthesis at L 4-L 5with associated bilateral synovial cyst causing critical spinal stenosis.  Patient presents now for decompression and fusion in hopes of improving his symptoms.  Past Medical History:  Diagnosis Date  . ANXIETY 01/19/2008  . Cervicalgia 01/19/2008  . DISORDER OF BONE AND CARTILAGE UNSPECIFIED 01/11/2008  . Heart murmur    when you were born, states he outgrew  . HYPERLIPIDEMIA 01/19/2008  . Hypertension   . Lumbar spinal stenosis 11/08/2020  . Melanoma ([place]) 09/09/2015  . Optic neuropathy   . 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 PSA, INCREASED 07/12/2010  . RASH-NONVESICULAR 01/19/2008  . Stroke [facility]) 10/2020    Past Surgical History:  Procedure Laterality Date  . CATARACT EXTRACTION    . HERNIA REPAIR    . lumbar disease    . ulner nerve Left     Family History  Problem Relation Age of Onset  . Lumbar disc disease Father   . Multiple sclerosis Other   . Diabetes Other   . Cancer Other        lung cancer   Social History:  reports that he quit smoking about 2 months ago. He has never used smokeless tobacco. He reports current alcohol use of about 10.0 standard drinks of alcohol per week. No history on file for drug use.  Allergies:  Allergies  Allergen Reactions  . Penicillins Other (See Comments)    Childhood allergy    Medications Prior to Admission  Medication Sig Dispense Refill  . acetaminophen (TYLENOL) 500 MG tablet Take 1,000 mg by mouth every 8 (eight) hours as needed for mild pain.    Marland Kitchen amLODipine (NORVASC) 5 MG tablet Take 1 tablet (5 mg total) by mouth daily. 30 tablet 11  . aspirin  EC 81 MG EC tablet Take 1 tablet (81 mg total) by mouth daily. Swallow whole. 30 tablet 11  . pregabalin (LYRICA) 75 MG capsule Take 75 mg by mouth 2 (two) times daily.     . rosuvastatin (CRESTOR) 40 MG tablet Take 0.5 tablets (20 mg total) by mouth daily. 45 tablet 11  . clopidogrel (PLAVIX) 75 MG tablet Take 1 tablet (75 mg total) by mouth daily. (Patient not taking: No sig reported) 21 tablet 0  . sildenafil (VIAGRA) 100 MG tablet TAKE 1 TABLET BY MOUTH EVERY OTHER DAY AS NEEDED (Patient taking differently: Take 100 mg by mouth as needed for erectile dysfunction.) 10 tablet 11    Results for orders placed or performed during the hospital encounter of 01/22/21 (from the past 48 hour(s))  SARS CORONAVIRUS 2 (TAT 6-24 HRS) Nasopharyngeal Nasopharyngeal Swab     Status: None   Collection Time: 01/22/21  9:54 AM   Specimen: Nasopharyngeal Swab  Result Value Ref Range   SARS Coronavirus 2 NEGATIVE NEGATIVE    Comment: (NOTE) SARS-CoV-2 target nucleic acids are NOT DETECTED.  The SARS-CoV-2 RNA is generally detectable in upper and lower respiratory specimens during the acute phase of infection. Negative results do not preclude SARS-CoV-2 infection, do not rule out co-infections with other pathogens, and should not be used as the sole basis for treatment or other patient management decisions. Negative results must be combined with  clinical observations, patient history, and epidemiological information. The expected result is Negative.  Fact Sheet for Patients: SugarRoll.be  Fact Sheet for Healthcare Providers: https://www.woods-mathews.com/  This test is not yet approved or cleared by the [place] FDA and  has been authorized for detection and/or diagnosis of SARS-CoV-2 by FDA under an Emergency Use Authorization (EUA). This EUA will remain  in effect (meaning this test can be used) for the duration of the COVID-19 declaration under Se ction  564(b)(1) of the Act, 21 U.S.C. section 360bbb-3(b)(1), unless the authorization is terminated or revoked sooner.  Performed at [facility], [place] [address] [place],  [address]    No results found.  Pertinent items noted in HPI and remainder of comprehensive ROS otherwise negative.  Blood pressure 134/73, pulse 81, temperature (!) 97.5 F (36.4 C), temperature source Temporal, resp. rate 18, height 6\' 1"  (1.854 m), weight 76.2 kg, SpO2 100 %.  Patient is awake and alert.  He is oriented and appropriate.  Speech is fluent.  Judgment insight are intact.  Cranial nerve function normal bilateral.  Motor examination extremities intact bilateral.  Gait antalgic.  Posture mildly flexed peer examination head ears eyes nose throat summer.  Chest and abdomen are benign.  Extremities are free from injury deformity. Assessment/Plan L 4-L 5degenerative spondylolisthesis with associated bilateral L 4-L 5adherent synovial cyst with severe stenosis and intractable back pain.  Plan bilateral L 4-L 5decompressive laminotomies and resection of synovial cyst followed by posterior lumbar interbody fusion utilizing interbody cages, allograft, and augmented with posterior lateral arthrodesis utilizing nonsegmental pedicle screw fixation and local autografting.  Risks and benefits have been discussed.  Patient agrees to proceed.  [name] 01/23/2021, 10:31 AM

## 2021-01-23 NOTE — Anesthesia Procedure Notes (Signed)
Procedure Name: Intubation Date/Time: 01/23/2021 11:08 AM Performed by: Hoy Morn, CRNA Pre-anesthesia Checklist: Patient identified, Emergency Drugs available, Suction available and Patient being monitored Patient Re-evaluated:Patient Re-evaluated prior to induction Oxygen Delivery Method: Circle system utilized Preoxygenation: Pre-oxygenation with 100% oxygen Induction Type: IV induction Ventilation: Mask ventilation without difficulty Laryngoscope Size: Mac and 4 Grade View: Grade I Tube type: Oral Tube size: 7.5 mm Number of attempts: 1 Airway Equipment and Method: Stylet Placement Confirmation: ETT inserted through vocal cords under direct vision,  positive ETCO2 and breath sounds checked- equal and bilateral Secured at: 25 cm Tube secured with: Tape Dental Injury: Teeth and Oropharynx as per pre-operative assessment  Comments: Atraumatic oral intubation by Dorene Grebe, SRNA.

## 2021-01-23 NOTE — Telephone Encounter (Signed)
Completed and placed in out box.  Thank you.

## 2021-01-23 NOTE — Brief Op Note (Signed)
01/23/2021  1:13 PM  PATIENT:  Avon Gully  70 y.o. male  PRE-OPERATIVE DIAGNOSIS:  Synovial cyst  POST-OPERATIVE DIAGNOSIS:  Synovial cyst  PROCEDURE:  Procedure(s): Posterior Lumbar Interbody Fusion - Lumbar four-Lumbar five (N/A)  SURGEON:  Surgeon(s) and Role:    Earnie Larsson, MD - Primary  PHYSICIAN ASSISTANT:   ASSISTANTSMearl Latin   ANESTHESIA:   general  EBL:  200 mL   BLOOD ADMINISTERED:none  DRAINS: none   LOCAL MEDICATIONS USED:  MARCAINE     SPECIMEN:  No Specimen  DISPOSITION OF SPECIMEN:  N/A  COUNTS:  YES  TOURNIQUET:  * No tourniquets in log *  DICTATION: .Dragon Dictation  PLAN OF CARE: Admit for overnight observation  PATIENT DISPOSITION:  PACU - hemodynamically stable.   Delay start of Pharmacological VTE agent (>24hrs) due to surgical blood loss or risk of bleeding: yes

## 2021-01-23 NOTE — Telephone Encounter (Signed)
Fax confirmation received [phone].

## 2021-01-23 NOTE — Transfer of Care (Signed)
Immediate Anesthesia Transfer of Care Note  Patient: Gerald Jenkins  Procedure(s) Performed: Posterior Lumbar Interbody Fusion - Lumbar four-Lumbar five (N/A Back)  Patient Location: PACU  Anesthesia Type:General  Level of Consciousness: drowsy and patient cooperative  Airway & Oxygen Therapy: Patient Spontanous Breathing and Patient connected to face mask oxygen  Post-op Assessment: Report given to RN, Post -op Vital signs reviewed and stable and Patient moving all extremities  Post vital signs: Reviewed and stable  Last Vitals:  Vitals Value Taken Time  BP 131/85 01/23/21 1330  Temp    Pulse 71 01/23/21 1331  Resp    SpO2 100 % 01/23/21 1331  Vitals shown include unvalidated device data.  Last Pain:  Vitals:   01/23/21 1024  TempSrc:   PainSc: 0-No pain         Complications: No complications documented.

## 2021-01-24 DIAGNOSIS — M4316 Spondylolisthesis, lumbar region: Secondary | ICD-10-CM | POA: Diagnosis not present

## 2021-01-24 MED ORDER — HYDROCODONE-ACETAMINOPHEN 10-325 MG PO TABS: 1.0000 | ORAL_TABLET | ORAL | 0 refills | Status: DC | PRN

## 2021-01-24 MED ORDER — CYCLOBENZAPRINE HCL 10 MG PO TABS: 10.0000 mg | ORAL_TABLET | Freq: Three times a day (TID) | ORAL | 0 refills | Status: DC | PRN

## 2021-01-24 NOTE — Plan of Care (Signed)
Pt doing well. Pt given D/C instructions with verbal understanding. Rx's were sent to the pharmacy by MD. Pt's incision is clean and dry with no sign of infection. Pt's IV was removed prior to D/C. Pt D/C'd home via wheelchair per MD order. Pt is stable @ D/C and has no other needs at this time. Lenah Messenger, RN  

## 2021-01-24 NOTE — Discharge Instructions (Signed)

## 2021-01-24 NOTE — Evaluation (Signed)
Physical Therapy Evaluation and Discharge Patient Details Name: Gerald Jenkins MRN: [id number] DOB: 1951/01/02 Today's Date: 01/24/2021   History of Present Illness  Pt is a 70 y/o male who presents s/p L4-L5 PLIF on 01/23/2021. PMH significant for CVA (10/2020), R eye macular degeneration, L eye optic neuropathy.    Clinical Impression  Patient evaluated by Physical Therapy with no further acute PT needs identified. All education has been completed and the patient has no further questions. Pt was able to demonstrate transfers and ambulation with gross modified independence and no AD. Pt was educated on precautions, brace application/wearing schedule, appropriate activity progression, and car transfer. See below for any follow-up Physical Therapy or equipment needs. PT is signing off. Thank you for this referral.     Follow Up Recommendations No PT follow up;Supervision for mobility/OOB    Equipment Recommendations  None recommended by PT    Recommendations for Other Services       Precautions / Restrictions Precautions Precautions: Fall;Back Precaution Booklet Issued: Yes (comment) Precaution Comments: Reviewed handout and pt was cued for precautions during functional mobility Required Braces or Orthoses: Spinal Brace Spinal Brace: Lumbar corset;Applied in sitting position Restrictions Weight Bearing Restrictions: No      Mobility  Bed Mobility Overal bed mobility: Needs Assistance Bed Mobility: Rolling;Sidelying to Sit Rolling: Modified independent (Device/Increase time) Sidelying to sit: Supervision       General bed mobility comments: VC's throughout for optimal log roll technique.    Transfers Overall transfer level: Modified independent Equipment used: None             General transfer comment: Pt demonstrated proper hand placement on seated surface for safety with power-up to full stand.  Ambulation/Gait Ambulation/Gait assistance: Modified independent  (Device/Increase time) Gait Distance (Feet): 250 Feet Assistive device: None Gait Pattern/deviations: Step-through pattern;Decreased stride length;Trunk flexed Gait velocity: Decreased Gait velocity interpretation: <1.31 ft/sec, indicative of household ambulator General Gait Details: VC's for improved posture, and forward gaze. No assist required and no overt LOB noted.  Stairs Stairs: Yes Stairs assistance: Supervision Stair Management: One rail Right;Alternating pattern;Forwards Number of Stairs: 10 General stair comments: VC's for sequencing and general safety. No assist required.  Wheelchair Mobility    Modified Rankin (Stroke Patients Only)       Balance Overall balance assessment: Needs assistance Sitting-balance support: Feet supported;No upper extremity supported Sitting balance-Leahy Scale: Fair     Standing balance support: No upper extremity supported;During functional activity Standing balance-Leahy Scale: Poor Standing balance comment: Reliant on UE support                             Pertinent Vitals/Pain Pain Assessment: Faces Faces Pain Scale: Hurts a little bit Pain Location: incision site - low back Pain Descriptors / Indicators: Operative site guarding;Sore Pain Intervention(s): Monitored during session;Limited activity within patient's tolerance;Repositioned    Home Living Family/patient expects to be discharged to:: Private residence Living Arrangements: Spouse/significant other;Children Available Help at Discharge: Family;Available PRN/intermittently Type of Home: House Home Access: Stairs to enter Entrance Stairs-Rails: None Entrance Stairs-Number of Steps: 4 Home Layout: Two level;Bed/bath upstairs Home Equipment: Shower seat 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    Prior Function Level of Independence: Independent               Hand Dominance   Dominant Hand: Left    Extremity/Trunk Assessment    Upper Extremity Assessment Upper Extremity Assessment: Defer to  OT evaluation    Lower Extremity Assessment Lower Extremity Assessment: Generalized weakness    Cervical / Trunk Assessment Cervical / Trunk Assessment: Other exceptions Cervical / Trunk Exceptions: s/p surgery  Communication   Communication: No difficulties  Cognition Arousal/Alertness: Awake/alert Behavior During Therapy: WFL for tasks assessed/performed Overall Cognitive Status: Within Functional Limits for tasks assessed                                        General Comments      Exercises     Assessment/Plan    PT Assessment Patent does not need any further PT services  PT Problem List         PT Treatment Interventions      PT Goals (Current goals can be found in the Care Plan section)  Acute Rehab PT Goals Patient Stated Goal: Home today PT Goal Formulation: All assessment and education complete, DC therapy    Frequency     Barriers to discharge        Co-evaluation               AM-PAC PT "6 Clicks" Mobility  Outcome Measure Help needed turning from your back to your side while in a flat bed without using bedrails?: None Help needed moving from lying on your back to sitting on the side of a flat bed without using bedrails?: None Help needed moving to and from a bed to a chair (including a wheelchair)?: None Help needed standing up from a chair using your arms (e.g., wheelchair or bedside chair)?: None Help needed to walk in hospital room?: None Help needed climbing 3-5 steps with a railing? : None 6 Click Score: 24    End of Session Equipment Utilized During Treatment: Gait belt;Back brace Activity Tolerance: Patient tolerated treatment well Patient left: in chair;with call bell/phone within reach Nurse Communication: Mobility status PT Visit Diagnosis: Unsteadiness on feet (R26.81);Pain Pain - part of body:  (back)    Time: 6825-7493 PT Time Calculation  (min) (ACUTE ONLY): 21 min   Charges:   PT Evaluation $PT Eval Low Complexity: 1 Low          Rolinda Roan, PT, DPT Acute Rehabilitation Services Pager: [phone] Office: [phone]   Thelma Comp 01/24/2021, 10:57 AM

## 2021-01-24 NOTE — Discharge Summary (Signed)
Physician Discharge Summary  Patient ID: Gerald Jenkins MRN: [id number] DOB/AGE: 05/30/1951 70 y.o.  Admit date: 01/23/2021 Discharge date: 01/24/2021  Admission Diagnoses:  Discharge Diagnoses:  Active Problems:   Synovial cyst of lumbar facet joint   Discharged Condition: good  Hospital Course: Patient admitted to the hospital where he underwent uncomplicated lumbar decompression and fusion including resection of bilateral L4-5 synovial cyst.  Postoperatively doing very well.  Preoperative back and lower extremity pain much improved.  Standing and walking well.  Pain well controlled.  Ready for discharge home.  Consults:   Significant Diagnostic Studies:   Treatments:   Discharge Exam: Blood pressure 135/66, pulse 87, temperature 98.2 F (36.8 C), temperature source Oral, resp. rate 18, height 6\' 1"  (1.854 m), weight 76.2 kg, SpO2 100 %. Awake and alert.  Oriented and appropriate.  Motor and sensory function intact.  Wound clean and dry.  Chest and abdomen benign.  Disposition: Discharge disposition: 01-Home or Self Care        Allergies as of 01/24/2021      Reactions   Penicillins Other (See Comments)   Childhood allergy      Medication List    TAKE these medications   acetaminophen 500 MG tablet Commonly known as: TYLENOL Take 1,000 mg by mouth every 8 (eight) hours as needed for mild pain.   amLODipine 5 MG tablet Commonly known as: NORVASC Take 1 tablet (5 mg total) by mouth daily.   aspirin 81 MG EC tablet Take 1 tablet (81 mg total) by mouth daily. Swallow whole.   cyclobenzaprine 10 MG tablet Commonly known as: FLEXERIL Take 1 tablet (10 mg total) by mouth 3 (three) times daily as needed for muscle spasms.   HYDROcodone-acetaminophen 10-325 MG tablet Commonly known as: NORCO Take 1 tablet by mouth every 4 (four) hours as needed for moderate pain ((score 4 to 6)).   pregabalin 75 MG capsule Commonly known as: LYRICA Take 75 mg by mouth 2 (two)  times daily.   rosuvastatin 40 MG tablet Commonly known as: CRESTOR Take 0.5 tablets (20 mg total) by mouth daily.   sildenafil 100 MG tablet Commonly known as: Viagra TAKE 1 TABLET BY MOUTH EVERY OTHER DAY AS NEEDED What changed:   how much to take  how to take this  when to take this  reasons to take this  additional instructions            Durable Medical Equipment  (From admission, onward)         Start     Ordered   01/23/21 1511  DME Walker rolling  Once       Question:  Patient needs a walker to treat with the following condition  Answer:  Synovial cyst of lumbar facet joint   01/23/21 1510   01/23/21 1511  DME 3 n 1  Once        01/23/21 1510           Signed: Mallie Mussel A Damyen Knoll 01/24/2021, 8:30 AM

## 2021-01-24 NOTE — Evaluation (Signed)
Occupational Therapy Evaluation Patient Details Name: Gerald Jenkins MRN: [id number] DOB: Feb 09, 1951 Today's Date: 01/24/2021    History of Present Illness Pt is a 70 y/o male who presents s/p L4-L5 PLIF on 01/23/2021. PMH significant for CVA (10/2020), R eye macular degeneration, L eye optic neuropathy.   Clinical Impression   Patient admitted for the above procedure.  He is sore, but essentially near baseline function for sit/stand ADL, toileting and functional in room mobility.  All questions answered, and patient with good follow through on all education.  No further acute or post acute OT needs.      Follow Up Recommendations  No OT follow up    Equipment Recommendations  None recommended by OT    Recommendations for Other Services       Precautions / Restrictions Precautions Precautions: Fall;Back Precaution Booklet Issued: Yes (comment) Precaution Comments: Reviewed handout and pt was cued for precautions during functional mobility Required Braces or Orthoses: Spinal Brace Spinal Brace: Lumbar corset;Applied in sitting position;Applied in standing position Restrictions Weight Bearing Restrictions: No      Mobility Bed Mobility Overal bed mobility: Needs Assistance Bed Mobility: Rolling;Sidelying to Sit;Sit to Supine Rolling: Modified independent (Device/Increase time) Sidelying to sit: Supervision   Sit to supine: Supervision   General bed mobility comments: VC's throughout for optimal log roll technique.    Transfers Overall transfer level: Modified independent Equipment used: None             General transfer comment: Pt demonstrated proper hand placement on seated surface for safety with power-up to full stand.    Balance Overall balance assessment: No apparent balance deficits (not formally assessed) Sitting-balance support: Feet supported;No upper extremity supported Sitting balance-Leahy Scale: Fair     Standing balance support: No upper extremity  supported;During functional activity Standing balance-Leahy Scale: Poor Standing balance comment: Reliant on UE support                           ADL either performed or assessed with clinical judgement   ADL Overall ADL's : At baseline                                       General ADL Comments: Patient with increased soreness noted.  Reviewed back precautions, safe ADL seated, and donn/doff brace.     Vision Patient Visual Report: No change from baseline       Perception     Praxis      Pertinent Vitals/Pain Pain Assessment: Faces Faces Pain Scale: Hurts a little bit Pain Location: incision site - low back Pain Descriptors / Indicators: Operative site guarding;Sore Pain Intervention(s): Monitored during session     Hand Dominance Left   Extremity/Trunk Assessment Upper Extremity Assessment Upper Extremity Assessment: Overall WFL for tasks assessed   Lower Extremity Assessment Lower Extremity Assessment: Defer to PT evaluation   Cervical / Trunk Assessment Cervical / Trunk Assessment: Other exceptions Cervical / Trunk Exceptions: s/p surgery   Communication Communication Communication: No difficulties   Cognition Arousal/Alertness: Awake/alert Behavior During Therapy: WFL for tasks assessed/performed Overall Cognitive Status: Within Functional Limits for tasks assessed  Home Living Family/patient expects to be discharged to:: Private residence Living Arrangements: Spouse/significant other;Children Available Help at Discharge: Family;Available PRN/intermittently Type of Home: House Home Access: Stairs to enter Entrance Stairs-Number of Steps: 4 Entrance Stairs-Rails: None Home Layout: Two level;Bed/bath upstairs Alternate Level Stairs-Number of Steps: 10 Alternate Level Stairs-Rails: Right;Left Bathroom Shower/Tub: Occupational psychologist:  Standard     Home Equipment: Shower seat;Hand held shower head   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      Prior Functioning/Environment Level of Independence: Independent                 OT Problem List: Pain      OT Treatment/Interventions:      OT Goals(Current goals can be found in the care plan section) Acute Rehab OT Goals Patient Stated Goal: Discharge today OT Goal Formulation: With patient Time For Goal Achievement: 01/24/21 Potential to Achieve Goals: Good  OT Frequency:     Barriers to D/C:  none noted          Co-evaluation              AM-PAC OT "6 Clicks" Daily Activity     Outcome Measure Help from another person eating meals?: None Help from another person taking care of personal grooming?: None Help from another person toileting, which includes using toliet, bedpan, or urinal?: None Help from another person bathing (including washing, rinsing, drying)?: None Help from another person to put on and taking off regular upper body clothing?: None Help from another person to put on and taking off regular lower body clothing?: None 6 Click Score: 24   End of Session Equipment Utilized During Treatment: Back brace Nurse Communication: Mobility status  Activity Tolerance: Patient tolerated treatment well Patient left: in chair;with call bell/phone within reach  OT Visit Diagnosis: Pain                Time: 1045-1105 OT Time Calculation (min): 20 min Charges:  OT General Charges $OT Visit: 1 Visit OT Evaluation $OT Eval Moderate Complexity: 1 Mod  01/24/2021  Rich, OTR/L  Acute Rehabilitation Services  Office:  [phone]   Metta Clines 01/24/2021, 11:28 AM

## 2021-01-29 MED FILL — Heparin Sodium (Porcine) Inj 1000 Unit/ML: INTRAMUSCULAR | Qty: 30 | Status: AC

## 2021-01-29 MED FILL — Sodium Chloride IV Soln 0.9%: INTRAVENOUS | Qty: 1000 | Status: AC

## 2021-06-06 ENCOUNTER — Encounter: Payer: Self-pay | Admitting: Adult Health

## 2021-06-06 ENCOUNTER — Ambulatory Visit (INDEPENDENT_AMBULATORY_CARE_PROVIDER_SITE_OTHER): Payer: BC Managed Care – PPO | Admitting: Adult Health

## 2021-06-06 VITALS — BP 152/81 | HR 83 | Ht 73.0 in | Wt 173.0 lb

## 2021-06-06 DIAGNOSIS — I639 Cerebral infarction, unspecified: Secondary | ICD-10-CM | POA: Diagnosis not present

## 2021-06-06 DIAGNOSIS — I10 Essential (primary) hypertension: Secondary | ICD-10-CM | POA: Diagnosis not present

## 2021-06-06 DIAGNOSIS — E785 Hyperlipidemia, unspecified: Secondary | ICD-10-CM | POA: Diagnosis not present

## 2021-06-06 NOTE — Progress Notes (Signed)
[facility] [address] [place]. [place] [address] [phone]       STROKE FOLLOW UP NOTE  [name] Date of Birth:  1951-11-17 Medical Record Number:  [id number]   Reason for Referral: stroke follow up    SUBJECTIVE:   CHIEF COMPLAINT:  Chief Complaint  Patient presents with   Follow-up    TR alone Pt is well and stable      HPI:   Today, 06/06/2021, Gerald Jenkins returns for 19-month stroke follow-up.  Stable from stroke standpoint without new or reoccurring stroke/TIA symptoms.  Compliant on aspirin and Crestor without associated side effects.  Blood pressure today 152/81.  He continues to refrain from tobacco use. He did undergo uncomplicated lumbar decompression and fusion including resection of bilateral L4-5 synovial cyst on 01/23/2021 by Dr. Annette Stable.  He has a follow-up visit with Dr. Annette Stable today - he does report improvement of pain since procedure.  No new concerns at this time.    History provided for reference purposes only Initial visit 11/30/2020 JM: Gerald Jenkins is being seen for hospital follow-up unaccompanied.  Doing well since discharge without residual deficits and denies new stroke/TIA symptoms. He continues to have back issues and being seen by [facility] Dr. Nelva Bush (pain) and Dr. Rolena Infante and may need surgical procedure.  He speaks of possibly second opinion with neurosurgery to ensure surgical procedure is his only option at this point.  Remains on DAPT with completing Plavix tomorrow and continuing on aspirin alone without bleeding or bruising.  Continues on Crestor 40 mg daily without myalgias.  Blood pressure today satisfactory at 125/76.  Prior tobacco use socially with complete abstinence since discharge.  No further concerns at this time.  Stroke admission 11/09/2020 Gerald Jenkins is a 70 y.o. male with history of melanoma, anxiety, and HLD who presented on 11/09/2020 with balance issues and fall.  Personally reviewed hospitalization  pertinent progress notes, lab work and imaging with summary provided.  Evaluated by Dr. 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  Recommended DAPT for 3 weeks and aspirin alone.  No prior hx of HTN with BP elevated 150-170s allowing permissive hypertension and long-term BP goal normotensive range. Hx of HLD previously on Crestor but self discontinued 6 months prior with LDL 183 therefore recommended restarting Crestor 40 mg daily.  Tobacco cessation education provided.  EtOH abuse with cessation counseling provided.  Other active problems include recent steroid injections and hyponatremia.  Evaluated by therapies and discharged home in stable condition without therapy needs.  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  Vascular risk factors of hyperlipidemia, borderline hypertension, silent lacunar strokes and smoking CT head No acute stroke. Small vessel disease. Old R thalamic and R caudate body lacunes. ASPECTS 10.    CTA head & neck no LVO. Mild atherosclerosis B ICA bifurcations.  CT perfusion Unremarkable  MRI  L PLIC infarct. Small vessel disease. Atrophy. Old R thalamic and R centrum semiovale lacunes 2D Echo EF 60 to 65% LDL 183 HgbA1c 5.5  Lovenox 40 mg sq daily for VTE prophylaxis No antithrombotic prior to admission, now on aspirin 81 mg daily. Add plavix 75. Continue DAPT x 3 weeks then aspirin alone.   Therapy recommendations: no therapy needs Disposition:  Return home     ROS:   14 system  review of systems performed and negative with exception of no complaints  PMH:  Past Medical History:  Diagnosis Date   ANXIETY 01/19/2008   Cervicalgia 01/19/2008   DISORDER OF BONE AND CARTILAGE UNSPECIFIED 01/11/2008   Heart murmur    when you were born, states he outgrew   HYPERLIPIDEMIA 01/19/2008   Hypertension     Lumbar spinal stenosis 11/08/2020   Melanoma ([place]) 09/09/2015   Optic neuropathy    Osteoarthritis, hand 09/09/2015   PSA, INCREASED 07/12/2010   RASH-NONVESICULAR 01/19/2008   Stroke ([place]) 10/2020    PSH:  Past Surgical History:  Procedure Laterality Date   CATARACT EXTRACTION     HERNIA REPAIR     lumbar disease     SPINAL FUSION  01/2021   ulner nerve Left     Social History:  Social History   Socioeconomic History   Marital status: Single    Spouse name: Not on file   Number of children: 2   Years of education: Not on file   Highest education level: Not on file  Occupational History   Occupation: banking executive [employer]  Tobacco Use   Smoking status: Former    Pack years: 0.00    Types: Cigarettes    Quit date: 11/09/2020    Years since quitting: 0.5   Smokeless tobacco: Never  Vaping Use   Vaping Use: Never used  Substance and Sexual Activity   Alcohol use: Yes    Alcohol/week: 10.0 standard drinks    Types: 10 Standard drinks or equivalent per week   Drug use: Not on file   Sexual activity: Not Currently  Other Topics Concern   Not on file  Social History Narrative   Not on file   Social Determinants of Health   Financial Resource Strain: Not on file  Food Insecurity: Not on file  Transportation Needs: Not on file  Physical Activity: Not on file  Stress: Not on file  Social Connections: Not on file  Intimate Partner Violence: Not on file    Family History:  Family History  Problem Relation Age of Onset   Lumbar disc disease Father    Multiple sclerosis Other    Diabetes Other    Cancer Other        lung cancer    Medications:   Current Outpatient Medications on File Prior to Visit  Medication Sig Dispense Refill   acetaminophen (TYLENOL) 500 MG tablet Take 1,000 mg by mouth every 8 (eight) hours as needed for mild pain.     amLODipine (NORVASC) 5 MG tablet Take 1 tablet (5 mg total) by mouth daily. 30 tablet 11   aspirin EC 81 MG EC  tablet Take 1 tablet (81 mg total) by mouth daily. Swallow whole. 30 tablet 11   pregabalin (LYRICA) 75 MG capsule Take 75 mg by mouth 2 (two) times daily.      rosuvastatin (CRESTOR) 40 MG tablet Take 0.5 tablets (20 mg total) by mouth daily. 45 tablet 11   sildenafil (VIAGRA) 100 MG tablet TAKE 1 TABLET BY MOUTH EVERY OTHER DAY AS NEEDED (Patient taking differently: Take 100 mg by mouth as needed for erectile dysfunction.) 10 tablet 11   No current facility-administered medications on file prior to visit.    Allergies:   Allergies  Allergen Reactions   Penicillins Other (See Comments)    Childhood allergy      OBJECTIVE:  Physical Exam  Vitals:   06/06/21 0932  BP: (!) 152/81  Pulse: 83  Weight: 173 lb (78.5 kg)  Height: 6\' 1"  (1.854 m)    Body mass index is 22.82 kg/m. No results found.  General: well developed, well nourished,  pleasant middle-age Caucasian male, seated, in no evident distress Head: head normocephalic and atraumatic.   Neck: supple with no carotid or supraclavicular bruits Cardiovascular: regular rate and rhythm, no murmurs Musculoskeletal: no deformity Skin:  no rash/petichiae Vascular:  Normal pulses all extremities   Neurologic Exam Mental Status: Awake and fully alert.   Fluent speech and language.  Oriented to place and time. Recent and remote memory intact. Attention span, concentration and fund of knowledge appropriate. Mood and affect appropriate.  Cranial Nerves: Pupils equal, briskly reactive to light. Extraocular movements full without nystagmus. Visual fields full to confrontation. Hearing intact. Facial sensation intact. Face, tongue, palate moves normally and symmetrically.  Motor: Normal bulk and tone. Normal strength in all tested extremity muscles. Sensory.: intact to touch , pinprick , position and vibratory sensation.  Coordination: Rapid alternating movements normal in all extremities. Finger-to-nose and heel-to-shin performed  accurately bilaterally. Gait and Station: Arises from chair without difficulty. Stance is normal. Gait demonstrates normal stride length and balance without use of assistive device. Reflexes: 1+ and symmetric. Toes downgoing.        ASSESSMENT: Gerald Jenkins is a 70 y.o. year old male presented with balance issues and fall on 11/09/2020 with stroke work-up revealing L PLIC infarct secondary to small vessel disease. Vascular risk factors include HLD, borderline HTN, silent lacunar strokes, tobacco use and EtOH use.      PLAN:  L PLIC stroke :  Recovered well without residual deficits.   continue aspirin 81 mg daily and continue Crestor 40 mg daily for secondary stroke prevention.   Discussed secondary stroke prevention measures and importance of close PCP follow up for aggressive stroke risk factor management  HTN: BP goal <130/90.  Stable on amlodipine per PCP HLD: LDL goal <70.  Prior LDL 183 -repeat lipid panel today.  Continue Crestor 20 mg daily  Spinal stenosis: s/p lumbar decompression and fusion including resection of bilateral L4-5 synovial cyst 01/23/2021 by Dr. Annette Stable - has f/u visit with Dr. Annette Stable today    Overall stable from stroke standpoint and recommend follow-up on an as-needed basis   CC:  [place] provider: Dr. Janene Harvey, Hunt Oris, MD    Frann Rider, [facility]  [facility] [address] [place], [place] [address]  Phone [phone] Fax [phone] Note: This document was prepared with digital dictation and possible smart phrase technology. Any transcriptional errors that result from this process are unintentional.

## 2021-06-06 NOTE — Patient Instructions (Signed)
We will check your cholesterol levels today  Continue aspirin 81 mg daily  and Crestor 40 mg daily for secondary stroke prevention  Continue to follow up with PCP regarding cholesterol and blood pressure management  Maintain strict control of hypertension with blood pressure goal below 130/90 and cholesterol with LDL cholesterol (bad cholesterol) goal below 70 mg/dL.        Thank you for coming to see [place] at [facility]. I hope we have been able to provide you high quality care today.  You may receive a patient satisfaction survey over the next few weeks. We would appreciate your feedback and comments so that we may continue to improve ourselves and the health of our patients.

## 2021-06-07 ENCOUNTER — Telehealth: Payer: Self-pay

## 2021-06-07 LAB — LIPID PANEL
Chol/HDL Ratio: 2.2 ratio (ref 0.0–5.0)
Cholesterol, Total: 231 mg/dL — ABNORMAL HIGH (ref 100–199)
HDL: 103 mg/dL (ref >39)
LDL Chol Calc (NIH): 114 mg/dL — ABNORMAL HIGH (ref 0–99)
Triglycerides: 83 mg/dL (ref 0–149)
VLDL Cholesterol Cal: 14 mg/dL (ref 5–40)

## 2021-06-07 NOTE — Telephone Encounter (Signed)
He will go to PCP for retesting

## 2021-06-07 NOTE — Progress Notes (Signed)
See telephone notes form 06/07/21

## 2021-06-07 NOTE — Telephone Encounter (Signed)
-----   Message from Frann Rider, NP sent at 06/07/2021  9:27 AM EDT ----- Please advise patient that recent lipid panel showed elevated LDL of 114 although greatly improved since restarting Crestor.  Please verify if patient is taking 20 mg or 40 mg.  If taking 40 mg, may need to transition to different statin as LDL remains above goal on current high-dose statin.  Thank you.

## 2021-06-07 NOTE — Telephone Encounter (Signed)
Perhaps I misunderstood at yesterday's visit but I thought he told me that he was fasting -if this is the case, he can follow-up with PCP for repeat lipid panel when fasting and then decide on Crestor dosage. Will CC to PCP Dr. Jenny Reichmann for [facility]. Thank you

## 2021-06-07 NOTE — Telephone Encounter (Signed)
Contacted pt to inform him recent lipid panel showed elevated LDL of 114 although greatly improved sincerestarting Crestor.  I verified that he is taking 20mg  not 40. He stated he had ate before this testing so do he need to retest or go ahead and increase to 40mg .

## 2021-06-15 NOTE — Progress Notes (Signed)
I agree with the above plan 

## 2021-10-30 ENCOUNTER — Telehealth: Payer: Self-pay | Admitting: Internal Medicine

## 2021-10-30 NOTE — Telephone Encounter (Signed)
Orders already in place, no change needed at this time

## 2021-10-30 NOTE — Telephone Encounter (Signed)
Patient requesting order for labs on 11-12-2021, prior to cpe appt on 11-14-2021

## 2021-10-31 NOTE — Telephone Encounter (Signed)
Patient notified via voicemail.

## 2021-11-06 ENCOUNTER — Other Ambulatory Visit (INDEPENDENT_AMBULATORY_CARE_PROVIDER_SITE_OTHER): Payer: BC Managed Care – PPO

## 2021-11-06 DIAGNOSIS — Z125 Encounter for screening for malignant neoplasm of prostate: Secondary | ICD-10-CM

## 2021-11-06 DIAGNOSIS — Z0000 Encounter for general adult medical examination without abnormal findings: Secondary | ICD-10-CM

## 2021-11-06 LAB — CBC WITH DIFFERENTIAL/PLATELET
Basophils Absolute: 0.1 10*3/uL (ref 0.0–0.1)
Basophils Relative: 1.1 % (ref 0.0–3.0)
Eosinophils Absolute: 0.3 10*3/uL (ref 0.0–0.7)
Eosinophils Relative: 4.3 % (ref 0.0–5.0)
HCT: 41.9 % (ref 39.0–52.0)
Hemoglobin: 13.9 g/dL (ref 13.0–17.0)
Lymphocytes Relative: 25.7 % (ref 12.0–46.0)
Lymphs Abs: 2 10*3/uL (ref 0.7–4.0)
MCHC: 33 g/dL (ref 30.0–36.0)
MCV: 89.8 fl (ref 78.0–100.0)
Monocytes Absolute: 0.9 10*3/uL (ref 0.1–1.0)
Monocytes Relative: 11.1 % (ref 3.0–12.0)
Neutro Abs: 4.5 10*3/uL (ref 1.4–7.7)
Neutrophils Relative %: 57.8 % (ref 43.0–77.0)
Platelets: 341 10*3/uL (ref 150.0–400.0)
RBC: 4.67 Mil/uL (ref 4.22–5.81)
RDW: 13.4 % (ref 11.5–15.5)
WBC: 7.8 10*3/uL (ref 4.0–10.5)

## 2021-11-06 LAB — BASIC METABOLIC PANEL
BUN: 13 mg/dL (ref 6–23)
CO2: 28 mEq/L (ref 19–32)
Calcium: 9.3 mg/dL (ref 8.4–10.5)
Chloride: 99 mEq/L (ref 96–112)
Creatinine, Ser: 1.08 mg/dL (ref 0.40–1.50)
GFR: 70.04 mL/min (ref >60.00)
Glucose, Bld: 80 mg/dL (ref 70–99)
Potassium: 4.5 mEq/L (ref 3.5–5.1)
Sodium: 136 mEq/L (ref 135–145)

## 2021-11-06 LAB — URINALYSIS, ROUTINE W REFLEX MICROSCOPIC
Bilirubin Urine: NEGATIVE
Hgb urine dipstick: NEGATIVE
Ketones, ur: NEGATIVE
Leukocytes,Ua: NEGATIVE
Nitrite: NEGATIVE
Specific Gravity, Urine: 1.01 (ref 1.000–1.030)
Total Protein, Urine: NEGATIVE
Urine Glucose: NEGATIVE
Urobilinogen, UA: 0.2 (ref 0.0–1.0)
pH: 6 (ref 5.0–8.0)

## 2021-11-06 LAB — HEPATIC FUNCTION PANEL
ALT: 14 U/L (ref 0–53)
AST: 17 U/L (ref 0–37)
Albumin: 4.5 g/dL (ref 3.5–5.2)
Alkaline Phosphatase: 52 U/L (ref 39–117)
Bilirubin, Direct: 0.1 mg/dL (ref 0.0–0.3)
Total Bilirubin: 0.4 mg/dL (ref 0.2–1.2)
Total Protein: 7.2 g/dL (ref 6.0–8.3)

## 2021-11-06 LAB — PSA: PSA: 1.02 ng/mL (ref 0.10–4.00)

## 2021-11-06 LAB — TSH: TSH: 3.4 u[IU]/mL (ref 0.35–5.50)

## 2021-11-14 ENCOUNTER — Other Ambulatory Visit: Payer: Self-pay

## 2021-11-14 ENCOUNTER — Ambulatory Visit (INDEPENDENT_AMBULATORY_CARE_PROVIDER_SITE_OTHER): Payer: BC Managed Care – PPO | Admitting: Internal Medicine

## 2021-11-14 ENCOUNTER — Encounter: Payer: Self-pay | Admitting: Internal Medicine

## 2021-11-14 VITALS — BP 110/70 | HR 71 | Temp 98.2°F | Ht 73.0 in | Wt 175.0 lb

## 2021-11-14 DIAGNOSIS — Z1211 Encounter for screening for malignant neoplasm of colon: Secondary | ICD-10-CM

## 2021-11-14 DIAGNOSIS — Z23 Encounter for immunization: Secondary | ICD-10-CM | POA: Diagnosis not present

## 2021-11-14 DIAGNOSIS — I10 Essential (primary) hypertension: Secondary | ICD-10-CM

## 2021-11-14 DIAGNOSIS — E785 Hyperlipidemia, unspecified: Secondary | ICD-10-CM

## 2021-11-14 DIAGNOSIS — Z8673 Personal history of transient ischemic attack (TIA), and cerebral infarction without residual deficits: Secondary | ICD-10-CM

## 2021-11-14 DIAGNOSIS — Z0000 Encounter for general adult medical examination without abnormal findings: Secondary | ICD-10-CM | POA: Diagnosis not present

## 2021-11-14 DIAGNOSIS — E559 Vitamin D deficiency, unspecified: Secondary | ICD-10-CM | POA: Diagnosis not present

## 2021-11-14 DIAGNOSIS — R739 Hyperglycemia, unspecified: Secondary | ICD-10-CM

## 2021-11-14 DIAGNOSIS — E538 Deficiency of other specified B group vitamins: Secondary | ICD-10-CM

## 2021-11-14 DIAGNOSIS — Z0001 Encounter for general adult medical examination with abnormal findings: Secondary | ICD-10-CM

## 2021-11-14 DIAGNOSIS — I6523 Occlusion and stenosis of bilateral carotid arteries: Secondary | ICD-10-CM

## 2021-11-14 DIAGNOSIS — R972 Elevated prostate specific antigen [PSA]: Secondary | ICD-10-CM

## 2021-11-14 MED ORDER — AMLODIPINE BESYLATE 5 MG PO TABS: 5.0000 mg | ORAL_TABLET | Freq: Every day | ORAL | 3 refills | Status: DC

## 2021-11-14 MED ORDER — ROSUVASTATIN CALCIUM 40 MG PO TABS: 20.0000 mg | ORAL_TABLET | Freq: Every day | ORAL | 11 refills | Status: DC

## 2021-11-14 NOTE — Patient Instructions (Addendum)
Please check with insurance coverage on the Shingrix shingles shot , and have done at the local pharmacy.   You will be contacted regarding the referral for: cologuard (the kit comes to your home)  You had the flu shot today  Please continue all other medications as before, and refills have been done if requested.  Please have the pharmacy call with any other refills you may need.  Please continue your efforts at being more active, low cholesterol diet, and weight control.  You are otherwise up to date with prevention measures today.  Please keep your appointments with your specialists as you may have planned  We have discussed the Cardiac CT Score test to measure the calcification level (if any) in your heart arteries.  This test has been ordered in our [place], so please call [facility] directly, as they prefer this, at [phone] to be scheduled.   Please go to the LAB at the blood drawing area for the tests to be done  - just the cholesterol today (with the goal LDl to be less than 70)  You will be contacted by phone if any changes need to be made immediately.  Otherwise, you will receive a letter about your results with an explanation, but please check with MyChart first.  Please remember to sign up for MyChart if you have not done so, as this will be important to you in the future with finding out test results, communicating by private email, and scheduling acute appointments online when needed.  Please make an Appointment to return for your 1 year visit, or sooner if needed, with Lab testing by Appointment as well, to be done about 3-5 days before at the [place] (so this is for TWO appointments - please see the scheduling desk as you leave)   Due to the ongoing Covid 19 pandemic, our lab now requires an appointment for any labs done at our office.  If you need labs done and do not have an appointment, please call our office ahead of time to schedule before  presenting to the lab for your testing.

## 2021-11-14 NOTE — Progress Notes (Signed)
Patient ID: Gerald Jenkins, male   DOB: 10-23-1951, 70 y.o.   MRN: [id number]         Chief Complaint:: wellness exam and hld, hx of stroke, elevated bP, asympt carotid stenosis       HPI:  Gerald Jenkins is a 70 y.o. male here for wellness exam; due for flu shot, cologuard, declines colonoscopy, declines shingrix o/w up to date.                        Also s/p L4-5 fusion, now pain free, still somewhat general weak, but getting better. Has hx of TIA nov 2021 after beig off statin for 7 mo.  Imaging noted incidental mild bilateral carotid stenosis.  Pt denies chest pain, increased sob or doe, wheezing, orthopnea, PND, increased LE swelling, palpitations, dizziness or syncope.   Pt denies polydipsia, polyuria, or new focal neuro s/s.   Pt denies fever, wt loss, night sweats, loss of appetite, or other constitutional symptoms  To f/u derm later today     Wt Readings from Last 3 Encounters:  11/14/21 175 lb (79.4 kg)  06/06/21 173 lb (78.5 kg)  01/23/21 168 lb (76.2 kg)   BP Readings from Last 3 Encounters:  11/14/21 110/70  06/06/21 (!) 152/81  01/24/21 135/66   Immunization History  Administered Date(s) Administered   Fluad Quad(high Dose 65+) 11/08/2019, 11/08/2020, 11/14/2021   Influenza, High Dose Seasonal PF 11/06/2018   Influenza,inj,Quad PF,6+ Mos 09/07/2015, 10/02/2016   PFIZER(Purple Top)SARS-COV-2 Vaccination 01/06/2020, 01/27/2020, 03/20/2021, 08/02/2021   Pneumococcal Conjugate-13 11/08/2020   Pneumococcal Polysaccharide-23 11/08/2019   Td 07/10/2009   Tdap 11/08/2019   Zoster, Live 08/19/2013   There are no preventive care reminders to display for this patient.     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 His smoking use included cigarettes. He has never used smokeless tobacco. He reports current alcohol use of about 10.0 standard drinks per week. No history on file for drug use. family history includes Cancer in an other family member; Diabetes in an other family member; Lumbar disc disease in his father; Multiple sclerosis in an other family member. Allergies  Allergen Reactions   Penicillins Other (See Comments)    Childhood allergy   Current Outpatient Medications on File Prior to Visit  Medication Sig Dispense Refill   acetaminophen (TYLENOL) 500 MG tablet Take 1,000 mg by mouth every 8 (eight) hours as needed for mild pain.     aspirin EC 81 MG EC tablet Take 1 tablet (81 mg total) by mouth daily. Swallow whole. 30 tablet 11   sildenafil (VIAGRA) 100 MG tablet TAKE 1 TABLET BY MOUTH EVERY OTHER DAY AS NEEDED (Patient taking differently: Take 100 mg by mouth as needed  for erectile dysfunction.) 10 tablet 11   No current facility-administered medications on file prior to visit.        ROS:  All others reviewed and negative.  Objective        PE:  BP 110/70 (BP Location: Right Arm, Patient Position: Sitting, Cuff Size: Large)   Pulse 71   Temp 98.2 F (36.8 C) (Oral)   Ht 6\' 1"  (1.854 m)   Wt 175 lb (79.4 kg)   SpO2 98%   BMI 23.09 kg/m                 Constitutional: Pt appears in NAD               HENT: Head: NCAT.                Right Ear: External ear normal.                 Left Ear: External ear normal.                Eyes: . Pupils are equal, round, and reactive to light. Conjunctivae and EOM are normal               Nose:  without d/c or deformity               Neck: Neck supple. Gross normal ROM               Cardiovascular: Normal rate and regular rhythm.                 Pulmonary/Chest: Effort normal and breath sounds without rales or wheezing.                Abd:  Soft, NT, ND, + BS, no organomegaly               Neurological: Pt is alert. At baseline orientation, motor grossly intact               Skin: Skin is warm. No rashes, no other new lesions, LE edema - none               Psychiatric: Pt behavior is normal without agitation   Micro: none  Cardiac tracings I have personally interpreted today:  none  Pertinent Radiological findings (summarize): none   Lab Results  Component Value Date   WBC 7.8 11/06/2021   HGB 13.9 11/06/2021   HCT 41.9 11/06/2021   PLT 341.0 11/06/2021   GLUCOSE 80 11/06/2021   CHOL 231 (H) 06/06/2021   TRIG 83 06/06/2021   HDL 103 06/06/2021   LDLDIRECT 66.4 07/09/2010   LDLCALC 114 (H) 06/06/2021   ALT 14 11/06/2021   AST 17 11/06/2021   NA 136 11/06/2021   K 4.5 11/06/2021   CL 99 11/06/2021   CREATININE 1.08 11/06/2021   BUN 13 11/06/2021   CO2 28 11/06/2021   TSH 3.40 11/06/2021   PSA 1.02 11/06/2021   INR 0.9 11/09/2020   HGBA1C 5.5 11/09/2020   Assessment/Plan:  Gerald Jenkins is a 70 y.o. White or Caucasian [1] male with  has a past medical history of ANXIETY (01/19/2008), Cervicalgia (01/19/2008), DISORDER OF BONE AND CARTILAGE UNSPECIFIED (01/11/2008), Heart murmur, HYPERLIPIDEMIA (01/19/2008), Hypertension, Lumbar spinal stenosis (11/08/2020), Melanoma ([place]) (09/09/2015), Optic neuropathy, Osteoarthritis, hand (09/09/2015), PSA, INCREASED (07/12/2010), RASH-NONVESICULAR (01/19/2008), and Stroke ([place]) (10/2020).  History of stroke Stable, cont crestor and asa 81  HLD (hyperlipidemia) Lab Results  Component Value  Date   LDLCALC 114 (H) 06/06/2021   uncontrolled, goal ldl < 70, pt to continue current statin crestor 40, declines add zetia or repatha for now,  also for cardiac Ct score evaluation   HTN (hypertension) BP Readings from Last 3 Encounters:  11/14/21 110/70  06/06/21 (!) 152/81  01/24/21 135/66   Stable, pt to continue medical treatment  - norvasc   Asymptomatic carotid artery stenosis, bilateral Pt to continue statin and asa  Encounter for well adult exam with abnormal findings Age and sex appropriate education and counseling updated with regular exercise and diet Referrals for preventative services - declines colnoscopy, ok for cologuard Immunizations addressed - decline shingrix, ok for flu Smoking counseling  - quit smoking approx 1 yr ago Evidence for depression or other mood disorder - stable anxiety Most recent labs reviewed. I have personally reviewed and have noted: 1) the patient's medical and social history 2) The patient's current medications and supplements 3) The patient's height, weight, and BMI have been recorded in the chart  Followup: No follow-ups on file.  Cathlean Cower, MD 11/18/2021 6:21 PM [facility]

## 2021-11-18 DIAGNOSIS — Z8673 Personal history of transient ischemic attack (TIA), and cerebral infarction without residual deficits: Secondary | ICD-10-CM | POA: Insufficient documentation

## 2021-11-18 NOTE — Assessment & Plan Note (Signed)
Lab Results  Component Value Date   LDLCALC 114 (H) 06/06/2021   uncontrolled, goal ldl < 70, pt to continue current statin crestor 40, declines add zetia or repatha for now, also for cardiac Ct score evaluation

## 2021-11-18 NOTE — Assessment & Plan Note (Signed)
Pt to continue statin and asa

## 2021-11-18 NOTE — Assessment & Plan Note (Signed)
Stable, cont crestor and asa 81

## 2021-11-18 NOTE — Assessment & Plan Note (Signed)
Age and sex appropriate education and counseling updated with regular exercise and diet Referrals for preventative services - declines colnoscopy, ok for cologuard Immunizations addressed - decline shingrix, ok for flu Smoking counseling  - quit smoking approx 1 yr ago Evidence for depression or other mood disorder - stable anxiety Most recent labs reviewed. I have personally reviewed and have noted: 1) the patient's medical and social history 2) The patient's current medications and supplements 3) The patient's height, weight, and BMI have been recorded in the chart

## 2021-11-18 NOTE — Assessment & Plan Note (Signed)
BP Readings from Last 3 Encounters:  11/14/21 110/70  06/06/21 (!) 152/81  01/24/21 135/66   Stable, pt to continue medical treatment  - norvasc

## 2021-11-27 ENCOUNTER — Other Ambulatory Visit (INDEPENDENT_AMBULATORY_CARE_PROVIDER_SITE_OTHER): Payer: BC Managed Care – PPO

## 2021-11-27 ENCOUNTER — Encounter: Payer: Self-pay | Admitting: Internal Medicine

## 2021-11-27 DIAGNOSIS — E785 Hyperlipidemia, unspecified: Secondary | ICD-10-CM | POA: Diagnosis not present

## 2021-11-27 DIAGNOSIS — E538 Deficiency of other specified B group vitamins: Secondary | ICD-10-CM | POA: Diagnosis not present

## 2021-11-27 DIAGNOSIS — R972 Elevated prostate specific antigen [PSA]: Secondary | ICD-10-CM

## 2021-11-27 DIAGNOSIS — E559 Vitamin D deficiency, unspecified: Secondary | ICD-10-CM

## 2021-11-27 DIAGNOSIS — R739 Hyperglycemia, unspecified: Secondary | ICD-10-CM | POA: Diagnosis not present

## 2021-11-27 LAB — URINALYSIS, ROUTINE W REFLEX MICROSCOPIC
Bilirubin Urine: NEGATIVE
Hgb urine dipstick: NEGATIVE
Ketones, ur: NEGATIVE
Leukocytes,Ua: NEGATIVE
Nitrite: NEGATIVE
RBC / HPF: NONE SEEN (ref 0–?)
Specific Gravity, Urine: 1.01 (ref 1.000–1.030)
Total Protein, Urine: NEGATIVE
Urine Glucose: NEGATIVE
Urobilinogen, UA: 0.2 (ref 0.0–1.0)
WBC, UA: NONE SEEN (ref 0–?)
pH: 6.5 (ref 5.0–8.0)

## 2021-11-27 LAB — CBC WITH DIFFERENTIAL/PLATELET
Basophils Absolute: 0.1 10*3/uL (ref 0.0–0.1)
Basophils Relative: 0.9 % (ref 0.0–3.0)
Eosinophils Absolute: 0.2 10*3/uL (ref 0.0–0.7)
Eosinophils Relative: 2.9 % (ref 0.0–5.0)
HCT: 41.7 % (ref 39.0–52.0)
Hemoglobin: 14.1 g/dL (ref 13.0–17.0)
Lymphocytes Relative: 26.4 % (ref 12.0–46.0)
Lymphs Abs: 1.7 10*3/uL (ref 0.7–4.0)
MCHC: 33.9 g/dL (ref 30.0–36.0)
MCV: 88.2 fl (ref 78.0–100.0)
Monocytes Absolute: 0.8 10*3/uL (ref 0.1–1.0)
Monocytes Relative: 11.8 % (ref 3.0–12.0)
Neutro Abs: 3.8 10*3/uL (ref 1.4–7.7)
Neutrophils Relative %: 58 % (ref 43.0–77.0)
Platelets: 297 10*3/uL (ref 150.0–400.0)
RBC: 4.72 Mil/uL (ref 4.22–5.81)
RDW: 13 % (ref 11.5–15.5)
WBC: 6.6 10*3/uL (ref 4.0–10.5)

## 2021-11-27 LAB — BASIC METABOLIC PANEL
BUN: 10 mg/dL (ref 6–23)
CO2: 29 mEq/L (ref 19–32)
Calcium: 9.5 mg/dL (ref 8.4–10.5)
Chloride: 97 mEq/L (ref 96–112)
Creatinine, Ser: 1.03 mg/dL (ref 0.40–1.50)
GFR: 74.11 mL/min (ref >60.00)
Glucose, Bld: 81 mg/dL (ref 70–99)
Potassium: 4.8 mEq/L (ref 3.5–5.1)
Sodium: 134 mEq/L — ABNORMAL LOW (ref 135–145)

## 2021-11-27 LAB — HEMOGLOBIN A1C: Hgb A1c MFr Bld: 5.8 % (ref 4.6–6.5)

## 2021-11-27 LAB — PSA: PSA: 0.78 ng/mL (ref 0.10–4.00)

## 2021-11-27 LAB — LIPID PANEL
Cholesterol: 183 mg/dL (ref 0–200)
HDL: 85.2 mg/dL (ref >39.00)
LDL Cholesterol: 86 mg/dL (ref 0–99)
NonHDL: 97.79
Total CHOL/HDL Ratio: 2
Triglycerides: 58 mg/dL (ref 0.0–149.0)
VLDL: 11.6 mg/dL (ref 0.0–40.0)

## 2021-11-27 LAB — HEPATIC FUNCTION PANEL
ALT: 17 U/L (ref 0–53)
AST: 18 U/L (ref 0–37)
Albumin: 4.6 g/dL (ref 3.5–5.2)
Alkaline Phosphatase: 47 U/L (ref 39–117)
Bilirubin, Direct: 0.1 mg/dL (ref 0.0–0.3)
Total Bilirubin: 0.5 mg/dL (ref 0.2–1.2)
Total Protein: 7.2 g/dL (ref 6.0–8.3)

## 2021-11-27 LAB — TSH: TSH: 3.56 u[IU]/mL (ref 0.35–5.50)

## 2021-11-27 LAB — VITAMIN D 25 HYDROXY (VIT D DEFICIENCY, FRACTURES): VITD: 21.4 ng/mL — ABNORMAL LOW (ref 30.00–100.00)

## 2021-11-27 LAB — VITAMIN B12: Vitamin B-12: 261 pg/mL (ref 211–911)

## 2021-12-18 IMAGING — MR MR LUMBAR SPINE W/O CM
4 of 5 series · 25 of 48 positions shown · non-contrast
Comparison: None.

CLINICAL DATA: Right hip pain for 6 weeks. No acute injury or prior
relevant surgery. History of melanoma.

EXAM:
MRI LUMBAR SPINE WITHOUT CONTRAST
TECHNIQUE: Multiplanar, multisequence MR imaging of the lumbar spine was
performed. No intravenous contrast was administered.

[Series 2: T2 · sagittal · 4.0mm · 1.09mm/px · 6 of 16 slices shown (1 of 2)]
[im 1/16]
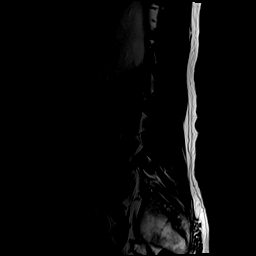
[im 4/16]
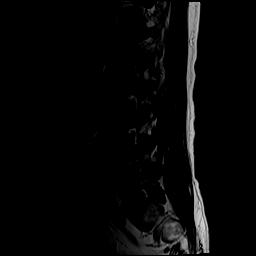
[im 7/16]
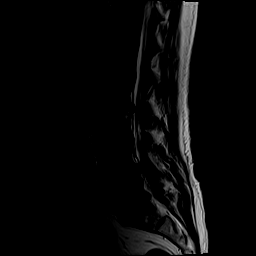
[im 10/16]
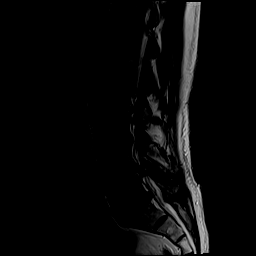
[im 13/16]
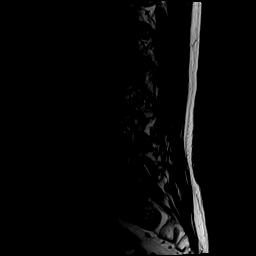
[im 16/16]
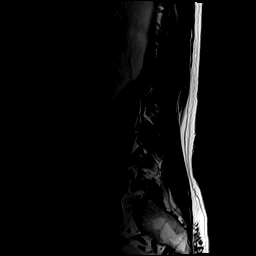

[Series 4: T1 · sagittal · 4.0mm · 1.09mm/px · 5 of 16 slices shown (1 of 2)]
[im 1/16]
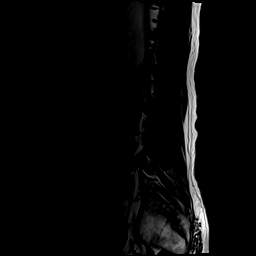
[im 4/16]
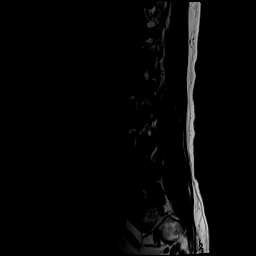
[im 8/16]
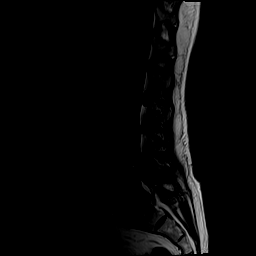
[im 12/16]
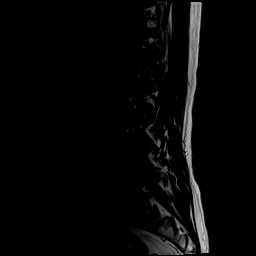
[im 16/16]
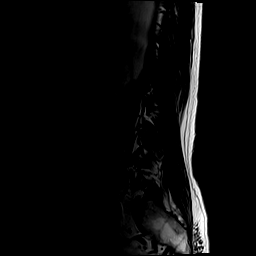

[Series 5: T2 · axial · 4.0mm · 0.39mm/px · z∈[-108,+107]mm · 10 of 46 slices shown (2 of 2)]
[im 4/46]
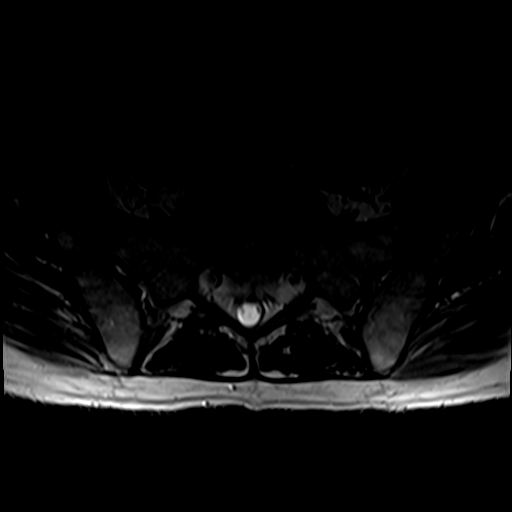
[im 7/46]
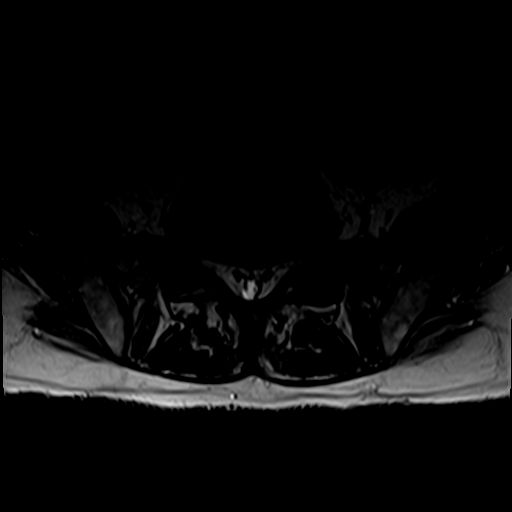
[im 10/46]
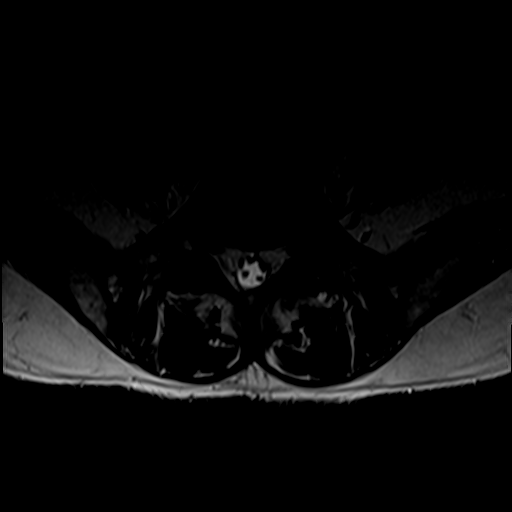
[im 16/46]
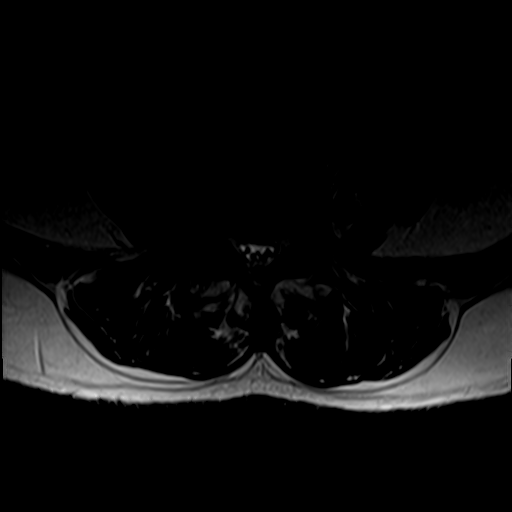
[im 22/46]
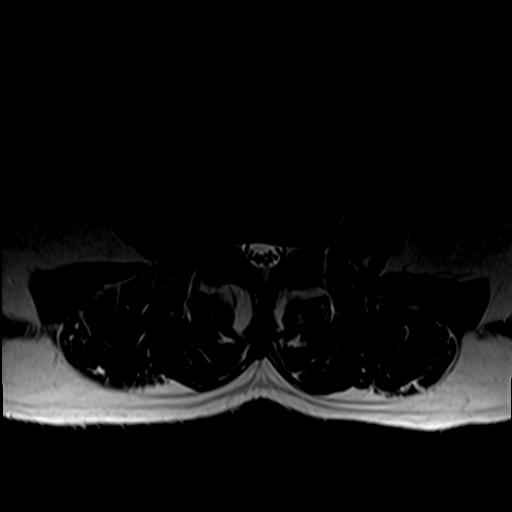
[im 25/46]
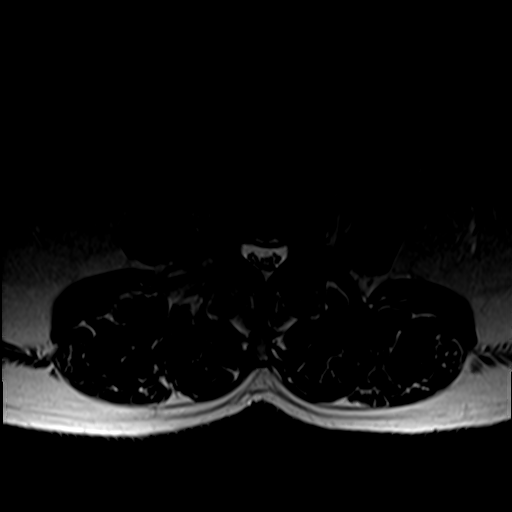
[im 28/46]
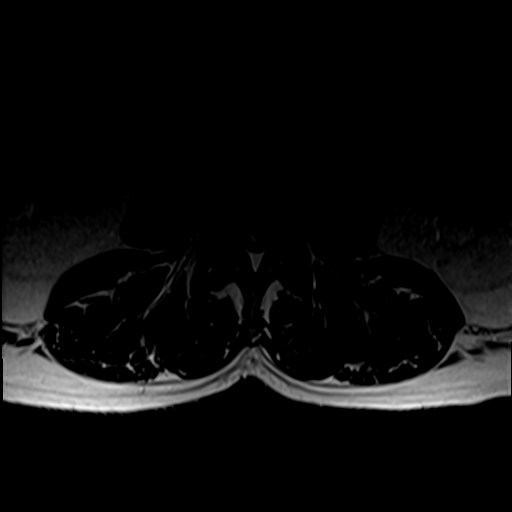
[im 34/46]
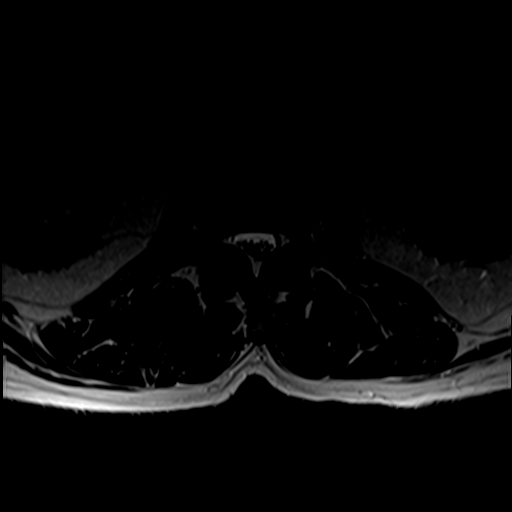
[im 40/46]
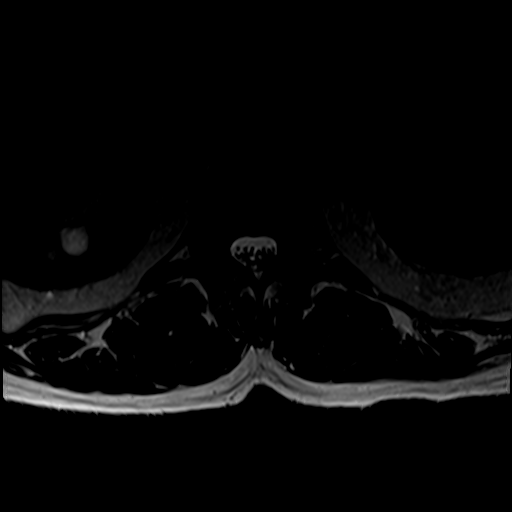
[im 46/46]
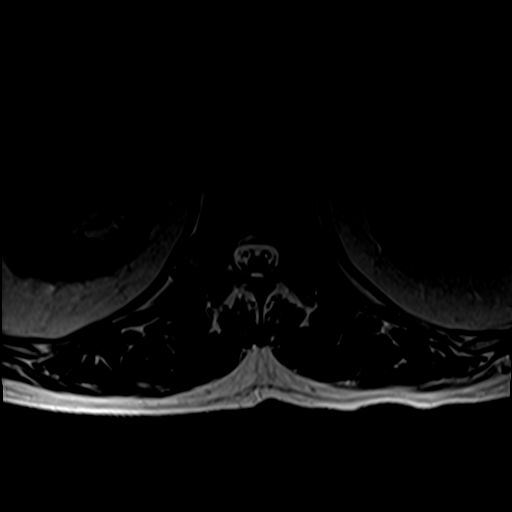

[Series 6: T1 · axial · 4.0mm · 0.39mm/px · z∈[-108,+79]mm · 4 of 46 slices shown (2 of 2)]
[im 4/46]
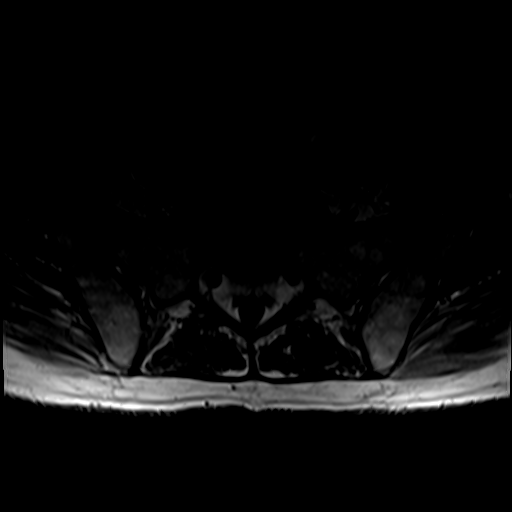
[im 7/46]
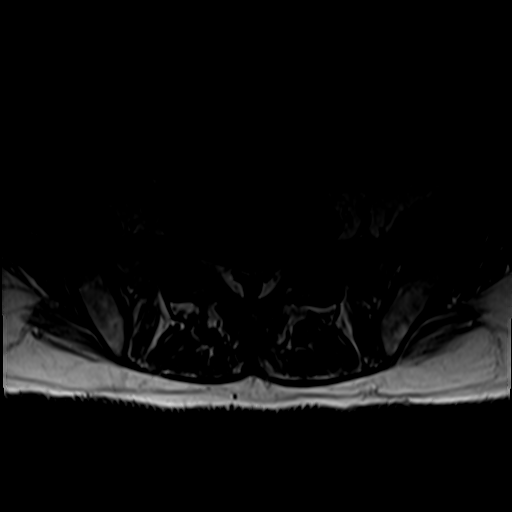
[im 25/46]
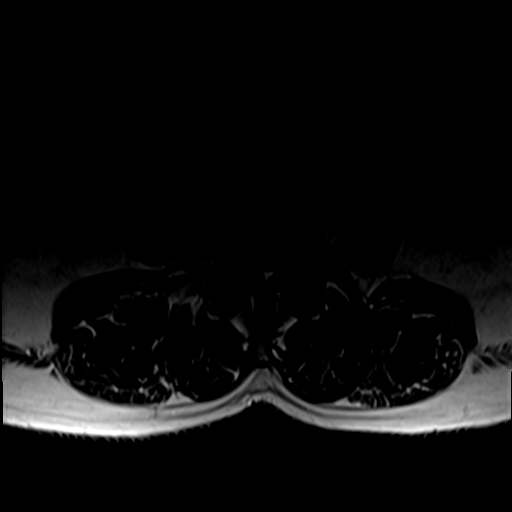
[im 40/46]
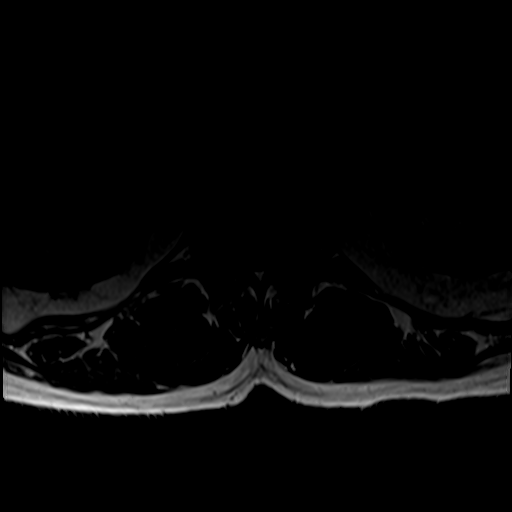

[25 of 48 positions shown; findings below may reference images not displayed]

FINDINGS: Segmentation: Conventional anatomy assumed, with the last open disc
space designated L5-S1.

Alignment: There is 2 mm of degenerative anterolisthesis at L4-5.
Otherwise normal.

Vertebrae: No worrisome osseous lesion, acute fracture or pars
defect. Mild endplate degenerative changes are present at L2-3. Mild
edema is present within the posterior elements at L4-5, attributed
to underlying facet disease. The lumbar pedicles are diffusely short
on a congenital basis.The visualized sacroiliac joints appear
unremarkable.

Conus medullaris: Extends to the T12-L1 level and appears normal.

Paraspinal and other soft tissues: No significant paraspinal
findings.

Disc levels:

No significant disc space findings at T11-12, T12-L1 or L1-2.

L2-3: Mild loss of disc height with annular disc bulging and
endplate degeneration. There is mild facet and ligamentous
hypertrophy. These factors contribute to mild spinal stenosis with
mild narrowing of the lateral recesses and foramina bilaterally.

L3-4: Disc height is relatively preserved. Mild disc bulging, facet
and ligamentous hypertrophy. No significant spinal stenosis or nerve
root encroachment.

L4-5: Loss of disc height with annular disc bulging and a
broad-based left subarticular zone and foraminal disc protrusion.
Moderate facet and ligamentous hypertrophy with an asymmetric
right-sided facet joint effusion. There is a synovial cyst
projecting medially from the right facet joint, measuring
approximately 12 x 7 x 4 mm. This exerts mass effect on the thecal
sac and likely encroaches on the right L5 nerve root. There is
underlying moderate to severe spinal stenosis with asymmetric
narrowing of the left lateral recess. Moderate foraminal narrowing
is present, left greater than right.

L5-S1: Shallow right paracentral disc protrusion is largely
contained within the ventral epidural fat. Mild facet and
ligamentous hypertrophy. Mild narrowing of the foramina and right
lateral recess without definite nerve root encroachment.
IMPRESSION: 1. Moderate to severe multifactorial spinal stenosis at L4-5. A
synovial cyst projecting medially from the right facet joint
contributes to mass effect on the thecal sac and probable right L5
nerve root encroachment. There is underlying narrowing of the left
lateral recess and both foramina.
2. Mild multifactorial spinal stenosis at L2-3 with mild narrowing
of the lateral recesses and foramina bilaterally.
3. Shallow right paracentral disc protrusion at L5-S1 without
definite nerve root encroachment.
4. Congenitally short pedicles.

## 2022-01-06 LAB — COLOGUARD: COLOGUARD: NEGATIVE

## 2022-08-03 ENCOUNTER — Other Ambulatory Visit: Payer: Self-pay | Admitting: Internal Medicine

## 2022-08-03 NOTE — Telephone Encounter (Signed)
Please refill as per office routine med refill policy (all routine meds to be refilled for 3 mo or monthly (per pt preference) up to one year from last visit, then month to month grace period for 3 mo, then further med refills will have to be denied) ? ?

## 2022-11-01 ENCOUNTER — Telehealth: Payer: Self-pay | Admitting: Internal Medicine

## 2022-11-01 ENCOUNTER — Other Ambulatory Visit: Payer: Self-pay

## 2022-11-01 DIAGNOSIS — Z125 Encounter for screening for malignant neoplasm of prostate: Secondary | ICD-10-CM

## 2022-11-01 DIAGNOSIS — N320 Bladder-neck obstruction: Secondary | ICD-10-CM

## 2022-11-01 DIAGNOSIS — R739 Hyperglycemia, unspecified: Secondary | ICD-10-CM

## 2022-11-01 DIAGNOSIS — I10 Essential (primary) hypertension: Secondary | ICD-10-CM

## 2022-11-01 DIAGNOSIS — E538 Deficiency of other specified B group vitamins: Secondary | ICD-10-CM

## 2022-11-01 DIAGNOSIS — E559 Vitamin D deficiency, unspecified: Secondary | ICD-10-CM

## 2022-11-01 NOTE — Telephone Encounter (Signed)
Ok labs are ordered 

## 2022-11-01 NOTE — Telephone Encounter (Signed)
See below , thanks

## 2022-11-01 NOTE — Telephone Encounter (Signed)
Patient is requesting labs prior to appointment

## 2022-11-01 NOTE — Telephone Encounter (Signed)
Patient is scheduled for CPE on 12/6 - he is requesting lab orders be placed so he can get those done a couple of days prior to his appointment.

## 2022-11-02 ENCOUNTER — Other Ambulatory Visit: Payer: Self-pay | Admitting: Internal Medicine

## 2022-11-02 NOTE — Telephone Encounter (Signed)
Please refill as per office routine med refill policy (all routine meds to be refilled for 3 mo or monthly (per pt preference) up to one year from last visit, then month to month grace period for 3 mo, then further med refills will have to be denied) ? ?

## 2022-11-04 ENCOUNTER — Telehealth: Payer: Self-pay | Admitting: Internal Medicine

## 2022-11-04 NOTE — Telephone Encounter (Signed)
Caller: ( SELF),  Relationship: ( SELF)  Call Back Number:  Date of Last Office Visit: 11/04/21 CPE  Date of Next Office Visit: CPE 11/20/22  Medication(s) to be Refilled: AMLODIPINE  Last Written: 11/14/21  Expires 11/14/22   Preferred Pharmacy: [facility] - [place], [place] - [address], [place] [address] Phone: [phone]  Fax: [phone]

## 2022-11-05 NOTE — Telephone Encounter (Signed)
Rx request sent to pharmacy.  

## 2022-11-13 ENCOUNTER — Other Ambulatory Visit (INDEPENDENT_AMBULATORY_CARE_PROVIDER_SITE_OTHER): Payer: BC Managed Care – PPO

## 2022-11-13 DIAGNOSIS — E538 Deficiency of other specified B group vitamins: Secondary | ICD-10-CM | POA: Diagnosis not present

## 2022-11-13 DIAGNOSIS — N320 Bladder-neck obstruction: Secondary | ICD-10-CM | POA: Diagnosis not present

## 2022-11-13 DIAGNOSIS — R739 Hyperglycemia, unspecified: Secondary | ICD-10-CM | POA: Diagnosis not present

## 2022-11-13 DIAGNOSIS — I10 Essential (primary) hypertension: Secondary | ICD-10-CM

## 2022-11-13 DIAGNOSIS — E559 Vitamin D deficiency, unspecified: Secondary | ICD-10-CM

## 2022-11-13 LAB — URINALYSIS, ROUTINE W REFLEX MICROSCOPIC
Bilirubin Urine: NEGATIVE
Hgb urine dipstick: NEGATIVE
Ketones, ur: NEGATIVE
Leukocytes,Ua: NEGATIVE
Nitrite: NEGATIVE
RBC / HPF: NONE SEEN (ref 0–?)
Specific Gravity, Urine: 1.01 (ref 1.000–1.030)
Total Protein, Urine: NEGATIVE
Urine Glucose: NEGATIVE
Urobilinogen, UA: 0.2 (ref 0.0–1.0)
WBC, UA: NONE SEEN (ref 0–?)
pH: 6 (ref 5.0–8.0)

## 2022-11-13 LAB — HEPATIC FUNCTION PANEL
ALT: 17 U/L (ref 0–53)
AST: 18 U/L (ref 0–37)
Albumin: 4.7 g/dL (ref 3.5–5.2)
Alkaline Phosphatase: 41 U/L (ref 39–117)
Bilirubin, Direct: 0.1 mg/dL (ref 0.0–0.3)
Total Bilirubin: 0.3 mg/dL (ref 0.2–1.2)
Total Protein: 7.3 g/dL (ref 6.0–8.3)

## 2022-11-13 LAB — CBC WITH DIFFERENTIAL/PLATELET
Basophils Absolute: 0.1 10*3/uL (ref 0.0–0.1)
Basophils Relative: 1 % (ref 0.0–3.0)
Eosinophils Absolute: 0.2 10*3/uL (ref 0.0–0.7)
Eosinophils Relative: 2.4 % (ref 0.0–5.0)
HCT: 40.5 % (ref 39.0–52.0)
Hemoglobin: 14 g/dL (ref 13.0–17.0)
Lymphocytes Relative: 28.7 % (ref 12.0–46.0)
Lymphs Abs: 1.8 10*3/uL (ref 0.7–4.0)
MCHC: 34.6 g/dL (ref 30.0–36.0)
MCV: 89.5 fl (ref 78.0–100.0)
Monocytes Absolute: 0.7 10*3/uL (ref 0.1–1.0)
Monocytes Relative: 11.1 % (ref 3.0–12.0)
Neutro Abs: 3.7 10*3/uL (ref 1.4–7.7)
Neutrophils Relative %: 56.8 % (ref 43.0–77.0)
Platelets: 280 10*3/uL (ref 150.0–400.0)
RBC: 4.53 Mil/uL (ref 4.22–5.81)
RDW: 13.2 % (ref 11.5–15.5)
WBC: 6.4 10*3/uL (ref 4.0–10.5)

## 2022-11-13 LAB — BASIC METABOLIC PANEL
BUN: 12 mg/dL (ref 6–23)
CO2: 29 mEq/L (ref 19–32)
Calcium: 9.2 mg/dL (ref 8.4–10.5)
Chloride: 99 mEq/L (ref 96–112)
Creatinine, Ser: 0.98 mg/dL (ref 0.40–1.50)
GFR: 78.14 mL/min (ref >60.00)
Glucose, Bld: 81 mg/dL (ref 70–99)
Potassium: 4.6 mEq/L (ref 3.5–5.1)
Sodium: 137 mEq/L (ref 135–145)

## 2022-11-13 LAB — LIPID PANEL
Cholesterol: 186 mg/dL (ref 0–200)
HDL: 82.8 mg/dL (ref >39.00)
LDL Cholesterol: 84 mg/dL (ref 0–99)
NonHDL: 103.07
Total CHOL/HDL Ratio: 2
Triglycerides: 93 mg/dL (ref 0.0–149.0)
VLDL: 18.6 mg/dL (ref 0.0–40.0)

## 2022-11-13 LAB — TSH: TSH: 4.23 u[IU]/mL (ref 0.35–5.50)

## 2022-11-13 LAB — PSA: PSA: 0.64 ng/mL (ref 0.10–4.00)

## 2022-11-13 LAB — VITAMIN B12: Vitamin B-12: 235 pg/mL (ref 211–911)

## 2022-11-13 LAB — HEMOGLOBIN A1C: Hgb A1c MFr Bld: 5.7 % (ref 4.6–6.5)

## 2022-11-13 LAB — VITAMIN D 25 HYDROXY (VIT D DEFICIENCY, FRACTURES): VITD: 27.33 ng/mL — ABNORMAL LOW (ref 30.00–100.00)

## 2022-11-20 ENCOUNTER — Ambulatory Visit (INDEPENDENT_AMBULATORY_CARE_PROVIDER_SITE_OTHER): Payer: BC Managed Care – PPO | Admitting: Internal Medicine

## 2022-11-20 VITALS — BP 138/84 | HR 89 | Temp 98.6°F | Ht 73.0 in | Wt 178.0 lb

## 2022-11-20 DIAGNOSIS — E559 Vitamin D deficiency, unspecified: Secondary | ICD-10-CM | POA: Diagnosis not present

## 2022-11-20 DIAGNOSIS — Z0001 Encounter for general adult medical examination with abnormal findings: Secondary | ICD-10-CM | POA: Diagnosis not present

## 2022-11-20 DIAGNOSIS — Z23 Encounter for immunization: Secondary | ICD-10-CM | POA: Diagnosis not present

## 2022-11-20 DIAGNOSIS — M4310 Spondylolisthesis, site unspecified: Secondary | ICD-10-CM | POA: Insufficient documentation

## 2022-11-20 DIAGNOSIS — I10 Essential (primary) hypertension: Secondary | ICD-10-CM | POA: Diagnosis not present

## 2022-11-20 DIAGNOSIS — E7800 Pure hypercholesterolemia, unspecified: Secondary | ICD-10-CM

## 2022-11-20 MED ORDER — SILDENAFIL CITRATE 100 MG PO TABS: ORAL_TABLET | ORAL | 11 refills | Status: DC

## 2022-11-20 MED ORDER — ROSUVASTATIN CALCIUM 40 MG PO TABS: 40.0000 mg | ORAL_TABLET | Freq: Every day | ORAL | 3 refills | Status: DC

## 2022-11-20 MED ORDER — AMLODIPINE BESYLATE 5 MG PO TABS: 5.0000 mg | ORAL_TABLET | Freq: Every day | ORAL | 3 refills | Status: DC

## 2022-11-20 NOTE — Progress Notes (Addendum)
Patient ID: Gerald Jenkins, male   DOB: Feb 13, 1951, 71 y.o.   MRN: [id number]         Chief Complaint:: wellness exam and low vit d, hld, htn       HPI:  Gerald Jenkins is a 71 y.o. male here for wellness exam; declines covid booster, for flu shot today, shingrix at pharmacy o/w up to date                        Also not taking Vit D.  Pt denies chest pain, increased sob or doe, wheezing, orthopnea, PND, increased LE swelling, palpitations, dizziness or syncope.   Pt denies polydipsia, polyuria, or new focal neuro s/s.    Pt denies fever, wt loss, night sweats, loss of appetite, or other constitutional symptoms   Pt denies polydipsia, polyuria, or new focal neuro s/s.      Wt Readings from Last 3 Encounters:  11/20/22 178 lb (80.7 kg)  11/14/21 175 lb (79.4 kg)  06/06/21 173 lb (78.5 kg)   BP Readings from Last 3 Encounters:  11/20/22 138/84  11/14/21 110/70  06/06/21 (!) 152/81   Immunization History  Administered Date(s) Administered   Fluad Quad(high Dose 65+) 11/08/2019, 11/08/2020, 11/14/2021, 11/20/2022   Influenza, High Dose Seasonal PF 11/06/2018   Influenza,inj,Quad PF,6+ Mos 09/07/2015, 10/02/2016   PFIZER(Purple Top)SARS-COV-2 Vaccination 01/06/2020, 01/27/2020, 03/20/2021, 08/02/2021   Pneumococcal Conjugate-13 11/08/2020   Pneumococcal Polysaccharide-23 11/08/2019   Td 07/10/2009   Tdap 11/08/2019   Zoster, Live 08/19/2013  There are no preventive care reminders to display for this patient.    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 His smoking use included cigarettes. He has never used smokeless tobacco. He reports current alcohol use of about 10.0 standard drinks of alcohol per week. No history on file for drug use. family history includes Cancer in an other family member; Diabetes in an other family member; Lumbar disc disease in his father; Multiple sclerosis in an other family member. Allergies  Allergen Reactions   Penicillins Other (See Comments)    Childhood allergy   Current Outpatient Medications on File Prior to Visit  Medication Sig Dispense Refill   acetaminophen (TYLENOL) 500 MG tablet Take 1,000 mg by mouth every 8 (eight) hours as needed for mild pain.     aspirin EC 81 MG EC tablet Take 1 tablet (81 mg total) by mouth daily. Swallow whole. 30 tablet 11   No current facility-administered medications on file prior to visit.        ROS:  All others reviewed and negative.  Objective        PE:  BP 138/84 (BP Location: Right Arm, Patient Position: Sitting, Cuff Size: Large)   Pulse 89  Temp 98.6 F (37 C) (Oral)   Ht '6\' 1"'$  (1.854 m)   Wt 178 lb (80.7 kg)   SpO2 97%   BMI 23.48 kg/m                 Constitutional: Pt appears in NAD               HENT: Head: NCAT.                Right Ear: External ear normal.                 Left Ear: External ear normal.                Eyes: . Pupils are equal, round, and reactive to light. Conjunctivae and EOM are normal               Nose: without d/c or deformity               Neck: Neck supple. Gross normal ROM               Cardiovascular: Normal rate and regular rhythm.                 Pulmonary/Chest: Effort normal and breath sounds without rales or wheezing.                Abd:  Soft, NT, ND, + BS, no organomegaly               Neurological: Pt is alert. At baseline  orientation, motor grossly intact               Skin: Skin is warm. No rashes, no other new lesions, LE edema - none               Psychiatric: Pt behavior is normal without agitation   Micro: none  Cardiac tracings I have personally interpreted today:  none  Pertinent Radiological findings (summarize): none   Lab Results  Component Value Date   WBC 6.4 11/13/2022   HGB 14.0 11/13/2022   HCT 40.5 11/13/2022   PLT 280.0 11/13/2022   GLUCOSE 81 11/13/2022   CHOL 186 11/13/2022   TRIG 93.0 11/13/2022   HDL 82.80 11/13/2022   LDLDIRECT 66.4 07/09/2010   LDLCALC 84 11/13/2022   ALT 17 11/13/2022   AST 18 11/13/2022   NA 137 11/13/2022   K 4.6 11/13/2022   CL 99 11/13/2022   CREATININE 0.98 11/13/2022   BUN 12 11/13/2022   CO2 29 11/13/2022   TSH 4.23 11/13/2022   PSA 0.64 11/13/2022   INR 0.9 11/09/2020   HGBA1C 5.7 11/13/2022   Assessment/Plan:  Gerald Jenkins is a 71 y.o. White or Caucasian [1] male with  has a past medical history of ANXIETY (01/19/2008), Cervicalgia (01/19/2008), DISORDER OF BONE AND CARTILAGE UNSPECIFIED (01/11/2008), Heart murmur, HYPERLIPIDEMIA (01/19/2008), Hypertension, Lumbar spinal stenosis (11/08/2020), Melanoma ([place]) (09/09/2015), Optic neuropathy, Osteoarthritis, hand (09/09/2015), PSA, INCREASED (07/12/2010), RASH-NONVESICULAR (01/19/2008), and Stroke ([place]) (10/2020).  Encounter for well adult exam with abnormal findings Age and sex appropriate education and counseling updated with regular exercise and diet Referrals for preventative services - none needed Immunizations addressed - declines covid booster, for flu shot today, and for shingrx at pharmacy Smoking counseling  - none needed Evidence for depression or other mood disorder - none significant Most recent labs reviewed. I have personally reviewed and have noted: 1) the patient's medical and social history 2) The  patient's current medications and supplements 3) The patient's height, weight, and BMI  have been recorded in the chart   HLD (hyperlipidemia) Lab Results  Component Value Date   LDLCALC 84 11/13/2022   Uncontrolled, goal ldl < 70, pt to increase to crestor 40 mg qd, and f/u lipids panel at 1 mo   HTN (hypertension) BP Readings from Last 3 Encounters:  11/20/22 138/84  11/14/21 110/70  06/06/21 (!) 152/81   Stable, pt to continue medical treatment norvasc 5 mg qd   Vitamin D deficiency Last vitamin D Lab Results  Component Value Date   VD25OH 27.33 (L) 11/13/2022   Low, to start oral replacement  Followup: Return in about 1 year (around 11/21/2023).  Cathlean Cower, MD 11/23/2022 2:02 PM [facility]

## 2022-11-20 NOTE — Patient Instructions (Signed)
You had the flu shot today  Please have your Shingrix (shingles) shots done at your local pharmacy.  Please take OTC Vitamin D3 at 2000 units per day, indefinitely  Ok to increase the Crestor to 40 mg, then plan to have a follow up lipid panel at approximately one month, at the [facility] lab (no appointment)  Please continue all other medications as before, and refills have been done if requested.  Please have the pharmacy call with any other refills you may need.  Please continue your efforts at being more active, low cholesterol diet, and weight control.  You are otherwise up to date with prevention measures today.  Please keep your appointments with your specialists as you may have planned  Please make an Appointment to return for your 1 year visit, or sooner if needed, with Lab testing by Appointment as well, to be done about 3-5 days before at the [facility] (so this is for TWO appointments - please see the scheduling desk as you leave)

## 2022-11-23 ENCOUNTER — Encounter: Payer: Self-pay | Admitting: Internal Medicine

## 2022-11-23 NOTE — Assessment & Plan Note (Signed)
BP Readings from Last 3 Encounters:  11/20/22 138/84  11/14/21 110/70  06/06/21 (!) 152/81   Stable, pt to continue medical treatment norvasc 5 mg qd

## 2022-11-23 NOTE — Assessment & Plan Note (Addendum)
Lab Results  Component Value Date   LDLCALC 84 11/13/2022   Uncontrolled, goal ldl < 70, pt to increase to crestor 40 mg qd, and f/u lipids panel at 1 mo

## 2022-11-23 NOTE — Addendum Note (Signed)
Addended by: Biagio Borg on: 11/23/2022 02:02 PM   Modules accepted: Level of Service

## 2022-11-23 NOTE — Assessment & Plan Note (Signed)
Last vitamin D Lab Results  Component Value Date   VD25OH 27.33 (L) 11/13/2022   Low, to start oral replacement

## 2022-11-23 NOTE — Assessment & Plan Note (Signed)
Age and sex appropriate education and counseling updated with regular exercise and diet Referrals for preventative services - none needed Immunizations addressed - declines covid booster, for flu shot today, and for shingrx at pharmacy Smoking counseling  - none needed Evidence for depression or other mood disorder - none significant Most recent labs reviewed. I have personally reviewed and have noted: 1) the patient's medical and social history 2) The patient's current medications and supplements 3) The patient's height, weight, and BMI have been recorded in the chart

## 2023-01-03 ENCOUNTER — Other Ambulatory Visit (INDEPENDENT_AMBULATORY_CARE_PROVIDER_SITE_OTHER): Payer: BC Managed Care – PPO

## 2023-01-03 DIAGNOSIS — E7800 Pure hypercholesterolemia, unspecified: Secondary | ICD-10-CM | POA: Diagnosis not present

## 2023-01-03 LAB — LIPID PANEL
Cholesterol: 155 mg/dL (ref 0–200)
HDL: 77.1 mg/dL (ref >39.00)
LDL Cholesterol: 62 mg/dL (ref 0–99)
NonHDL: 77.68
Total CHOL/HDL Ratio: 2
Triglycerides: 78 mg/dL (ref 0.0–149.0)
VLDL: 15.6 mg/dL (ref 0.0–40.0)

## 2023-08-29 ENCOUNTER — Other Ambulatory Visit: Payer: Self-pay | Admitting: Internal Medicine

## 2023-09-12 ENCOUNTER — Other Ambulatory Visit: Payer: Self-pay | Admitting: Internal Medicine

## 2023-11-12 ENCOUNTER — Telehealth: Payer: Self-pay | Admitting: Internal Medicine

## 2023-11-12 NOTE — Telephone Encounter (Signed)
Patient would like his labs put in the system before his appt on the 11th.  Please call patient and advise - [phone]

## 2023-11-12 NOTE — Telephone Encounter (Signed)
Called and left voice mail

## 2023-11-12 NOTE — Telephone Encounter (Signed)
Very sorry, this is not normally done as his primary insurance is medicare, which itself does not pay for blood tests prior to a visit, only the day of the visit or shortly after     thanks

## 2023-11-14 ENCOUNTER — Encounter: Payer: Self-pay | Admitting: Internal Medicine

## 2023-11-14 DIAGNOSIS — R739 Hyperglycemia, unspecified: Secondary | ICD-10-CM

## 2023-11-14 DIAGNOSIS — Z0001 Encounter for general adult medical examination with abnormal findings: Secondary | ICD-10-CM

## 2023-11-14 DIAGNOSIS — E7800 Pure hypercholesterolemia, unspecified: Secondary | ICD-10-CM

## 2023-11-14 DIAGNOSIS — Z125 Encounter for screening for malignant neoplasm of prostate: Secondary | ICD-10-CM

## 2023-11-14 DIAGNOSIS — N320 Bladder-neck obstruction: Secondary | ICD-10-CM

## 2023-11-14 DIAGNOSIS — E538 Deficiency of other specified B group vitamins: Secondary | ICD-10-CM

## 2023-11-14 DIAGNOSIS — E559 Vitamin D deficiency, unspecified: Secondary | ICD-10-CM

## 2023-11-17 NOTE — Telephone Encounter (Signed)
Pt called back wanting Dr Jonny Ruiz to know his is on [employer] and he is not on medicare. Please advise

## 2023-11-23 ENCOUNTER — Ambulatory Visit (HOSPITAL_COMMUNITY)
Admission: EM | Admit: 2023-11-23 | Discharge: 2023-11-23 | Disposition: A | Discharge status: Home / Self Care | Payer: BC Managed Care – PPO

## 2023-11-23 ENCOUNTER — Encounter (HOSPITAL_COMMUNITY): Payer: Self-pay | Admitting: Emergency Medicine

## 2023-11-23 DIAGNOSIS — S46812A Strain of other muscles, fascia and tendons at shoulder and upper arm level, left arm, initial encounter: Secondary | ICD-10-CM

## 2023-11-23 MED ORDER — METHOCARBAMOL 500 MG PO TABS: 500.0000 mg | ORAL_TABLET | Freq: Two times a day (BID) | ORAL | 0 refills | Status: DC | PRN

## 2023-11-23 MED ORDER — PREDNISONE 10 MG PO TABS: 30.0000 mg | ORAL_TABLET | Freq: Every day | ORAL | 0 refills | Status: DC

## 2023-11-23 MED ORDER — DEXAMETHASONE SODIUM PHOSPHATE 10 MG/ML IJ SOLN
10.0000 mg | Freq: Once | INTRAMUSCULAR | Status: AC
  Administered 2023-11-23: 10 mg via INTRAMUSCULAR

## 2023-11-23 MED ORDER — DEXAMETHASONE SODIUM PHOSPHATE 10 MG/ML IJ SOLN
INTRAMUSCULAR | Status: AC
Start: 2023-11-23 — End: ?
  Filled 2023-11-23: qty 1

## 2023-11-23 NOTE — ED Provider Notes (Signed)
[facility]    CSN: [id number] Arrival date & time: 11/23/23  1535      History   Chief Complaint Chief Complaint  Patient presents with   Shoulder Pain    HPI Gerald Jenkins is a 72 y.o. male.   72 year old male who presents to urgent care with complaints of left shoulder pain.  This started at 3 AM when he woke up thinking that he may have slept wrong on his shoulder.  When getting up this morning he noted that his shoulder was still painful.  He does not have decreased range of motion or decrease sensation.  His strength is intact.  He is left-handed.  He reports that doing small activities does not bother him however any larger activities such as reaching across his body or opening doors causes increased pain.  He denies any previous injury to the shoulder.  He did use to play golf and did play sports growing up but currently does not do either.   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   PSA, INCREASED 07/12/2010   RASH-NONVESICULAR 01/19/2008   Stroke (HCC) 10/2020    Patient Active Problem List   Diagnosis Date Noted   Degenerative spondylolisthesis 11/20/2022   Vitamin D deficiency 11/20/2022   History of stroke 11/18/2021   Asymptomatic carotid artery stenosis, bilateral 11/14/2021   Synovial cyst of lumbar facet joint 01/23/2021   Cerebral thrombosis with cerebral infarction 11/10/2020   Acute right-sided weakness 11/09/2020   Hyponatremia 11/09/2020   Alcohol abuse 11/09/2020   Former smoker 11/09/2020   HTN (hypertension) 11/09/2020   Lumbar spinal stenosis 11/08/2020    Sacroiliac joint pain 10/20/2020   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   Right lumbar radiculopathy 11/08/2019   Cubital tunnel syndrome 12/29/2017   Radial tunnel syndrome 12/29/2017   LLQ abdominal mass 10/06/2016   Melanoma (HCC) 09/09/2015   Osteoarthritis, 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 Authorizing Provider  Cholecalciferol (VITAMIN D3) 50 MCG (2000 UT) TABS Take 50 mcg by mouth daily.   Yes [provider]  acetaminophen (TYLENOL) 500 MG tablet Take 1,000 mg by mouth every 8 (eight) hours as needed for mild pain.    [provider]  amLODipine (NORVASC) 5 MG tablet Take 1 tablet (5 mg total) by mouth daily. 11/20/22   Corwin Levins, MD  aspirin EC 81 MG EC tablet Take 1 tablet (81 mg total) by mouth daily. Swallow whole. 11/11/20   Danford, Earl Lites, MD  rosuvastatin (CRESTOR) 40 MG tablet Take 1  tablet (40 mg total) by mouth daily. 11/20/22   Corwin Levins, MD  sildenafil (VIAGRA) 100 MG tablet TAKE 1 TABLET BY MOUTH EVERY OTHER DAY AS NEEDED 11/20/22   Corwin Levins, MD    Family History Family History  Problem Relation Age of Onset   Lumbar disc disease Father    Multiple sclerosis Other    Diabetes Other    Cancer Other        lung cancer    Social History Social History   Tobacco Use   Smoking status: Former    Current packs/day: 0.00    Types: Cigarettes    Quit date: 11/09/2020    Years since quitting: 3.0   Smokeless tobacco: Never  Vaping Use   Vaping status: Never Used  Substance Use  Topics   Alcohol use: Yes    Alcohol/week: 10.0 standard drinks of alcohol    Types: 10 Standard drinks or equivalent per week     Allergies   Penicillins   Review of Systems Review of Systems  Constitutional:  Negative for chills and fever.  HENT:  Negative for ear pain and sore throat.   Eyes:  Negative for pain and visual disturbance.  Respiratory:  Negative for cough and shortness of breath.   Cardiovascular:  Negative for chest pain and palpitations.  Gastrointestinal:  Negative for abdominal pain and vomiting.  Genitourinary:  Negative for dysuria and hematuria.  Musculoskeletal:  Negative for arthralgias and back pain.       Left shoulder pain  Skin:  Negative for color change and rash.  Neurological:  Negative for seizures and syncope.  All other systems reviewed and are negative.    Physical Exam Triage Vital Signs ED Triage Vitals  Encounter Vitals Group     BP 11/23/23 1551 (!) 156/84     Systolic BP Percentile --      Diastolic BP Percentile --      Pulse Rate 11/23/23 1551 84     Resp 11/23/23 1551 16     Temp 11/23/23 1551 98.7 F (37.1 C)     Temp Source 11/23/23 1551 Oral     SpO2 11/23/23 1551 97 %     Weight --      Height --      Head Circumference --      Peak Flow --      Pain Score 11/23/23 1549 6     Pain Loc --      Pain Education --      Exclude from Growth Chart --    No data found.  Updated Vital Signs BP (!) 156/84 (BP Location: Left Arm)   Pulse 84   Temp 98.7 F (37.1 C) (Oral)   Resp 16   SpO2 97%   Visual Acuity Right Eye Distance:   Left Eye Distance:   Bilateral Distance:    Right Eye Near:   Left Eye Near:    Bilateral Near:     Physical Exam Vitals and nursing note reviewed.  Constitutional:      General: He is not in acute distress.    Appearance: He is well-developed.  HENT:     Head: Normocephalic and atraumatic.  Eyes:     Conjunctiva/sclera: Conjunctivae normal.  Cardiovascular:     Rate and  Rhythm: Normal rate and regular rhythm.     Heart sounds: No murmur heard. Pulmonary:     Effort: Pulmonary effort is normal. No respiratory distress.  Breath sounds: Normal breath sounds.  Abdominal:     Palpations: Abdomen is soft.     Tenderness: There is no abdominal tenderness.  Musculoskeletal:        General: No swelling.     Left shoulder: Swelling and tenderness (Along the lateral aspect as well as across the superior aspect) present. No deformity, effusion, laceration, bony tenderness or crepitus. Normal range of motion. Normal strength. Normal pulse.     Cervical back: Neck supple.  Skin:    General: Skin is warm and dry.     Capillary Refill: Capillary refill takes less than 2 seconds.  Neurological:     Mental Status: He is alert.  Psychiatric:        Mood and Affect: Mood normal.      UC Treatments / Results  Labs (all labs ordered are listed, but only abnormal results are displayed) Labs Reviewed - No data to display  EKG   Radiology No results found.  Procedures Procedures (including critical care time)  Medications Ordered in UC Medications - No data to display  Initial Impression / Assessment and Plan / UC Course  I have reviewed the triage vital signs and the nursing notes.  Pertinent labs & imaging results that were available during my care of the patient were reviewed by me and considered in my medical decision making (see chart for details).     Strain of left trapezius muscle, initial encounter  Likely strain of the left trapezius muscle and possibly the deltoid as well.  This just started today.  There was no injury to the area.  This typically takes several days to completely resolve.  We will treat this with the following: Decadron injection given today for inflammation Tomorrow start prednisone 30 mg daily for 5 days.  Take this in the mornings May use Methocarbamol 500 mg twice daily as needed for muscle spasms.  Use caution as this  can cause drowsiness.  Advised patient he should not drive while taking this medication. Okay to continue using Tylenol as needed for discomfort while on steroids.  Avoid using Aleve or ibuprofen while on the steroids. Light stretching is recommended May use heating pad to help with discomfort Return to urgent care or PCP if symptoms worsen or fail to resolve.    Final Clinical Impressions(s) / UC Diagnoses   Final diagnoses:  None   Discharge Instructions   None    ED Prescriptions   None    PDMP not reviewed this encounter.   Landis Martins, [place] 11/23/23 1633

## 2023-11-23 NOTE — ED Triage Notes (Addendum)
Pt c/o shoulder pain that goes from under arm up to neck on left side this morning

## 2023-11-23 NOTE — Discharge Instructions (Addendum)
Likely strain of the left trapezius muscle and possibly the deltoid as well.  This typically takes several days to completely resolve.  We will treat this with the following: Decadron injection given today for inflammation Tomorrow start prednisone 30 mg daily for 5 days.  Take this in the mornings May use Methocarbamol 500 mg twice daily as needed for muscle spasms.  Use caution as this can cause drowsiness. Okay to continue using Tylenol as needed for discomfort while on steroids.  Avoid using Aleve or ibuprofen while on the steroids. Light stretching is recommended May use heating pad to help with discomfort Return to urgent care or PCP if symptoms worsen or fail to resolve.

## 2023-11-24 ENCOUNTER — Other Ambulatory Visit: Payer: Self-pay | Admitting: Internal Medicine

## 2023-11-24 ENCOUNTER — Other Ambulatory Visit (INDEPENDENT_AMBULATORY_CARE_PROVIDER_SITE_OTHER): Payer: BC Managed Care – PPO

## 2023-11-24 ENCOUNTER — Other Ambulatory Visit: Payer: Self-pay

## 2023-11-24 DIAGNOSIS — E559 Vitamin D deficiency, unspecified: Secondary | ICD-10-CM

## 2023-11-24 DIAGNOSIS — R739 Hyperglycemia, unspecified: Secondary | ICD-10-CM | POA: Diagnosis not present

## 2023-11-24 DIAGNOSIS — N320 Bladder-neck obstruction: Secondary | ICD-10-CM | POA: Diagnosis not present

## 2023-11-24 DIAGNOSIS — E7800 Pure hypercholesterolemia, unspecified: Secondary | ICD-10-CM

## 2023-11-24 DIAGNOSIS — E538 Deficiency of other specified B group vitamins: Secondary | ICD-10-CM | POA: Diagnosis not present

## 2023-11-24 LAB — URINALYSIS, ROUTINE W REFLEX MICROSCOPIC
Bilirubin Urine: NEGATIVE
Hgb urine dipstick: NEGATIVE
Ketones, ur: NEGATIVE
Leukocytes,Ua: NEGATIVE
Nitrite: NEGATIVE
Specific Gravity, Urine: 1.015 (ref 1.000–1.030)
Total Protein, Urine: 100 — AB
Urine Glucose: 250 — AB
Urobilinogen, UA: 1 (ref 0.0–1.0)
WBC, UA: NONE SEEN (ref 0–?)
pH: 7 (ref 5.0–8.0)

## 2023-11-24 LAB — BASIC METABOLIC PANEL
BUN: 14 mg/dL (ref 6–23)
CO2: 28 meq/L (ref 19–32)
Calcium: 9.9 mg/dL (ref 8.4–10.5)
Chloride: 102 meq/L (ref 96–112)
Creatinine, Ser: 0.92 mg/dL (ref 0.40–1.50)
GFR: 83.68 mL/min (ref >60.00)
Glucose, Bld: 129 mg/dL — ABNORMAL HIGH (ref 70–99)
Potassium: 4.4 meq/L (ref 3.5–5.1)
Sodium: 127 meq/L — ABNORMAL LOW (ref 135–145)

## 2023-11-24 LAB — CBC WITH DIFFERENTIAL/PLATELET
Basophils Absolute: 0.1 10*3/uL (ref 0.0–0.1)
Basophils Relative: 0.5 % (ref 0.0–3.0)
Eosinophils Absolute: 0 10*3/uL (ref 0.0–0.7)
Eosinophils Relative: 0 % (ref 0.0–5.0)
HCT: 44.1 % (ref 39.0–52.0)
Hemoglobin: 15.1 g/dL (ref 13.0–17.0)
Lymphocytes Relative: 10.7 % — ABNORMAL LOW (ref 12.0–46.0)
Lymphs Abs: 1.3 10*3/uL (ref 0.7–4.0)
MCHC: 34.1 g/dL (ref 30.0–36.0)
MCV: 89.5 fL (ref 78.0–100.0)
Monocytes Absolute: 0.5 10*3/uL (ref 0.1–1.0)
Monocytes Relative: 3.9 % (ref 3.0–12.0)
Neutro Abs: 10.6 10*3/uL — ABNORMAL HIGH (ref 1.4–7.7)
Neutrophils Relative %: 84.9 % — ABNORMAL HIGH (ref 43.0–77.0)
Platelets: 289 10*3/uL (ref 150.0–400.0)
RBC: 4.93 Mil/uL (ref 4.22–5.81)
RDW: 13.4 % (ref 11.5–15.5)
WBC: 12.5 10*3/uL — ABNORMAL HIGH (ref 4.0–10.5)

## 2023-11-24 LAB — LIPID PANEL
Cholesterol: 200 mg/dL (ref 0–200)
HDL: 101.9 mg/dL (ref >39.00)
LDL Cholesterol: 90 mg/dL (ref 0–99)
NonHDL: 98.14
Total CHOL/HDL Ratio: 2
Triglycerides: 41 mg/dL (ref 0.0–149.0)
VLDL: 8.2 mg/dL (ref 0.0–40.0)

## 2023-11-24 LAB — HEPATIC FUNCTION PANEL
ALT: 16 U/L (ref 0–53)
AST: 15 U/L (ref 0–37)
Albumin: 4.8 g/dL (ref 3.5–5.2)
Alkaline Phosphatase: 51 U/L (ref 39–117)
Bilirubin, Direct: 0.1 mg/dL (ref 0.0–0.3)
Total Bilirubin: 0.6 mg/dL (ref 0.2–1.2)
Total Protein: 8.1 g/dL (ref 6.0–8.3)

## 2023-11-24 LAB — VITAMIN B12: Vitamin B-12: 230 pg/mL (ref 211–911)

## 2023-11-24 LAB — VITAMIN D 25 HYDROXY (VIT D DEFICIENCY, FRACTURES): VITD: 57.43 ng/mL (ref 30.00–100.00)

## 2023-11-24 LAB — HEMOGLOBIN A1C: Hgb A1c MFr Bld: 5.9 % (ref 4.6–6.5)

## 2023-11-24 LAB — PSA: PSA: 0.89 ng/mL (ref 0.10–4.00)

## 2023-11-24 LAB — TSH: TSH: 1.34 u[IU]/mL (ref 0.35–5.50)

## 2023-11-26 ENCOUNTER — Encounter: Payer: Self-pay | Admitting: Internal Medicine

## 2023-11-26 ENCOUNTER — Ambulatory Visit: Payer: BC Managed Care – PPO | Admitting: Internal Medicine

## 2023-11-26 VITALS — BP 118/76 | HR 69 | Temp 98.6°F | Ht 73.0 in | Wt 180.0 lb

## 2023-11-26 DIAGNOSIS — E538 Deficiency of other specified B group vitamins: Secondary | ICD-10-CM

## 2023-11-26 DIAGNOSIS — R809 Proteinuria, unspecified: Secondary | ICD-10-CM | POA: Diagnosis not present

## 2023-11-26 DIAGNOSIS — Z0001 Encounter for general adult medical examination with abnormal findings: Secondary | ICD-10-CM

## 2023-11-26 DIAGNOSIS — E559 Vitamin D deficiency, unspecified: Secondary | ICD-10-CM

## 2023-11-26 DIAGNOSIS — E7800 Pure hypercholesterolemia, unspecified: Secondary | ICD-10-CM

## 2023-11-26 DIAGNOSIS — I739 Peripheral vascular disease, unspecified: Secondary | ICD-10-CM | POA: Diagnosis not present

## 2023-11-26 DIAGNOSIS — E871 Hypo-osmolality and hyponatremia: Secondary | ICD-10-CM | POA: Diagnosis not present

## 2023-11-26 DIAGNOSIS — I10 Essential (primary) hypertension: Secondary | ICD-10-CM

## 2023-11-26 DIAGNOSIS — Z23 Encounter for immunization: Secondary | ICD-10-CM | POA: Diagnosis not present

## 2023-11-26 DIAGNOSIS — R9431 Abnormal electrocardiogram [ECG] [EKG]: Secondary | ICD-10-CM | POA: Diagnosis not present

## 2023-11-26 DIAGNOSIS — R739 Hyperglycemia, unspecified: Secondary | ICD-10-CM

## 2023-11-26 DIAGNOSIS — Z0000 Encounter for general adult medical examination without abnormal findings: Secondary | ICD-10-CM

## 2023-11-26 MED ORDER — AMLODIPINE BESYLATE 5 MG PO TABS: 5.0000 mg | ORAL_TABLET | Freq: Every day | ORAL | 3 refills | Status: DC

## 2023-11-26 MED ORDER — EZETIMIBE 10 MG PO TABS: 10.0000 mg | ORAL_TABLET | Freq: Every day | ORAL | 3 refills | Status: DC

## 2023-11-26 MED ORDER — ROSUVASTATIN CALCIUM 40 MG PO TABS: 40.0000 mg | ORAL_TABLET | Freq: Every day | ORAL | 3 refills | Status: DC

## 2023-11-26 MED ORDER — SILDENAFIL CITRATE 100 MG PO TABS: ORAL_TABLET | ORAL | 11 refills | Status: AC

## 2023-11-26 NOTE — Assessment & Plan Note (Signed)
Mild new onset, with also low sodium as above, GFR stable Lab Results  Component Value Date   CREATININE 0.92 11/24/2023  Also for 24 hr urine protein

## 2023-11-26 NOTE — Assessment & Plan Note (Signed)
Lab Results  Component Value Date   VITAMINB12 230 11/24/2023   Low, to start oral replacement - b12 1000 mcg qd

## 2023-11-26 NOTE — Assessment & Plan Note (Signed)
Lab Results  Component Value Date   HGBA1C 5.9 11/24/2023   Stable, pt to continue current medical treatment  - diet, wt control

## 2023-11-26 NOTE — Progress Notes (Signed)
Patient ID: Gerald Jenkins, male   DOB: 10/02/1951, 72 y.o.   MRN: [id number]         Chief Complaint:: wellness exam and hld, proteinuria, low sodium, low b12, hyperglycemia, left trapezoid strain       HPI:  Gerald Jenkins is a 72 y.o. male here for wellness exam; for shingrx at pharmacy, declines covid booster, for flu shot today, o/w up to date                        Also had recent left upper back trapezoid msk strain with using arms to push off the hard chair at the basketball game, seen at ED with depomedrol inj, then prednisone 30 every day x 5 days, and muscle relaxer prn for which he has taken only a few doses.  Now finished and pain essentialy resolved.   Willing to take zetia for hld.  Pt denies chest pain, increased sob or doe, wheezing, orthopnea, PND, increased LE swelling, palpitations, dizziness or syncope.   Pt denies polydipsia, polyuria, or new focal neuro s/s.    Pt denies fever, wt loss, night sweats, loss of appetite, or other constitutional symptoms  Willing for Card Ct score.   Wt Readings from Last 3 Encounters:  11/26/23 180 lb (81.6 kg)  11/20/22 178 lb (80.7 kg)  11/14/21 175 lb (79.4 kg)   BP Readings from Last 3 Encounters:  11/26/23 118/76  11/23/23 (!) 156/84  11/20/22 138/84   Immunization History  Administered Date(s) Administered   Fluad Quad(high Dose 65+) 11/08/2019, 11/08/2020, 11/14/2021, 11/20/2022   Fluad Trivalent(High Dose 65+) 11/26/2023   Influenza, High Dose Seasonal PF 11/06/2018   Influenza,inj,Quad PF,6+ Mos 09/07/2015, 10/02/2016   PFIZER(Purple Top)SARS-COV-2 Vaccination 01/06/2020, 01/27/2020, 03/20/2021, 08/02/2021   Pneumococcal Conjugate-13 11/08/2020   Pneumococcal Polysaccharide-23 11/08/2019   Td 07/10/2009   Tdap 11/08/2019   Zoster, Live 08/19/2013   Health Maintenance Due  Topic Date Due   Zoster Vaccines- Shingrix (1 of 2) 05/18/1971      Past Medical History:  Diagnosis Date   ANXIETY 01/19/2008   Cervicalgia 01/19/2008    DISORDER OF BONE AND CARTILAGE UNSPECIFIED 01/11/2008   Heart murmur    when you were born, states he outgrew   HYPERLIPIDEMIA 01/19/2008   Hypertension    Lumbar spinal stenosis 11/08/2020   Melanoma (HCC) 09/09/2015   Optic neuropathy    Osteoarthritis, 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 His smoking use included cigarettes. He has never used smokeless tobacco. He reports current alcohol use of about 10.0 standard drinks of alcohol per week. No history on file for drug use. family history includes Cancer in an other family member; Diabetes in an other family member; Lumbar disc disease in his father; Multiple sclerosis in an other family member. Allergies  Allergen Reactions   Penicillins Other (See Comments)    Childhood allergy   Current Outpatient Medications on File Prior to Visit  Medication Sig Dispense Refill   acetaminophen (TYLENOL) 500 MG tablet Take 1,000 mg by mouth every 8 (eight) hours as needed for mild pain.  aspirin EC 81 MG EC tablet Take 1 tablet (81 mg total) by mouth daily. Swallow whole. 30 tablet 11   Cholecalciferol (VITAMIN D3) 50 MCG (2000 UT) TABS Take 50 mcg by mouth daily.     methocarbamol (ROBAXIN) 500 MG tablet Take 1 tablet (500 mg total) by mouth 2 (two) times daily as needed for muscle spasms. 20 tablet 0   No current facility-administered medications on file prior to visit.        ROS:  All others reviewed and negative.  Objective        PE:  BP 118/76 (BP Location: Right Arm, Patient Position: Sitting, Cuff Size: Normal)   Pulse 69   Temp 98.6 F (37 C) (Oral)   Ht 6\' 1"  (1.854 m)   Wt 180 lb (81.6 kg)   SpO2 99%   BMI 23.75 kg/m                 Constitutional: Pt  appears in NAD               HENT: Head: NCAT.                Right Ear: External ear normal.                 Left Ear: External ear normal.                Eyes: . Pupils are equal, round, and reactive to light. Conjunctivae and EOM are normal               Nose: without d/c or deformity               Neck: Neck supple. Gross normal ROM               Cardiovascular: Normal rate and regular rhythm.                 Pulmonary/Chest: Effort normal and breath sounds without rales or wheezing.                Abd:  Soft, NT, ND, + BS, no organomegaly               Neurological: Pt is alert. At baseline orientation, motor grossly intact               Skin: Skin is warm. No rashes, no other new lesions, LE edema - none               Psychiatric: Pt behavior is normal without agitation   Micro: none  Cardiac tracings I have personally interpreted today:  none  Pertinent Radiological findings (summarize): none   Lab Results  Component Value Date   WBC 12.5 (H) 11/24/2023   HGB 15.1 11/24/2023   HCT 44.1 11/24/2023   PLT 289.0 11/24/2023   GLUCOSE 129 (H) 11/24/2023   CHOL 200 11/24/2023   TRIG 41.0 11/24/2023   HDL 101.90 11/24/2023   LDLDIRECT 66.4 07/09/2010   LDLCALC 90 11/24/2023   ALT 16 11/24/2023   AST 15 11/24/2023   NA 127 (L) 11/24/2023   K 4.4 11/24/2023   CL 102 11/24/2023   CREATININE 0.92 11/24/2023   BUN 14 11/24/2023   CO2 28 11/24/2023   TSH 1.34 11/24/2023   PSA 0.89 11/24/2023   INR 0.9 11/09/2020   HGBA1C 5.9 11/24/2023   Assessment/Plan:  DAO STRITE is a 72 y.o. White or Caucasian [1] male with  has  a past medical history of ANXIETY (01/19/2008), Cervicalgia (01/19/2008), DISORDER OF BONE AND CARTILAGE UNSPECIFIED (01/11/2008), Heart murmur, HYPERLIPIDEMIA (01/19/2008), Hypertension, Lumbar spinal stenosis (11/08/2020), Melanoma (HCC) (09/09/2015), Optic neuropathy, Osteoarthritis, hand (09/09/2015), PSA, INCREASED (07/12/2010), RASH-NONVESICULAR (01/19/2008), and  Stroke (HCC) (10/2020).  Encounter for well adult exam with abnormal findings Age and sex appropriate education and counseling updated with regular exercise and diet Referrals for preventative services - none needed Immunizations addressed - declines covid booster, for shingrx at pharmacy, for flu shot today Smoking counseling  - none needed Evidence for depression or other mood disorder - none significant Most recent labs reviewed. I have personally reviewed and have noted: 1) the patient's medical and social history 2) The patient's current medications and supplements 3) The patient's height, weight, and BMI have been recorded in the chart   Vitamin D deficiency Last vitamin D Lab Results  Component Value Date   VD25OH 57.43 11/24/2023   Stable, cont oral replacement   HTN (hypertension) BP Readings from Last 3 Encounters:  11/26/23 118/76  11/23/23 (!) 156/84  11/20/22 138/84   Stable, pt to continue medical treatment norvasc 5 every day,    HLD (hyperlipidemia) Lab Results  Component Value Date   LDLCALC 90 11/24/2023   uncontrolled, pt to continue current statin crestor 40 every day, and add zetia 10 every day, with repeat lab testing in 1 mo, also fro Card Ct score   Hyponatremia Now 127, has been slightly low in past, no recent GI distress, reduced po intake, or diuretic use.  ? SIADH vs recent prednisone vs other - for f/u lab with SIADH labs  Proteinuria Mild new onset, with also low sodium as above, GFR stable Lab Results  Component Value Date   CREATININE 0.92 11/24/2023  Also for 24 hr urine protein  B12 deficiency Lab Results  Component Value Date   VITAMINB12 230 11/24/2023   Low, to start oral replacement - b12 1000 mcg qd   Hyperglycemia Lab Results  Component Value Date   HGBA1C 5.9 11/24/2023   Stable, pt to continue current medical treatment  - diet, wt control  Followup: Return in about 1 year (around 11/25/2024).  Oliver Barre, MD  11/26/2023 1:24 PM  Medical [facility]

## 2023-11-26 NOTE — Patient Instructions (Signed)
Please take all new medication as prescribed - the generic zetia 10 mg per day  Ok to take B12 1000 mcg per day (OTC) for 6 months, then stop  Please continue all other medications as before, and refills have been done if requested.  Please have the pharmacy call with any other refills you may need.  Please continue your efforts at being more active, low cholesterol diet, and weight control.  You are otherwise up to date with prevention measures today.  Please keep your appointments with your specialists as you may have planned  Please go to the LAB at the blood drawing area today for the 24 hr urine testing to be done as you can at home  Please also go to the LAB in 1 month or after for the follow up cholesterol and sodium labs  You will be contacted regarding the referral for: Cardiac CT score  Please make an Appointment to return for your 1 year visit, or sooner if needed, with other Lab testing by Appointment as well, to be done about 3-5 days before at the FIRST FLOOR Lab (so this is for TWO appointments - please see the scheduling desk as you leave)

## 2023-11-26 NOTE — Assessment & Plan Note (Addendum)
Age and sex appropriate education and counseling updated with regular exercise and diet Referrals for preventative services - none needed Immunizations addressed - declines covid booster, for shingrx at pharmacy, for flu shot today Smoking counseling  - none needed Evidence for depression or other mood disorder - none significant Most recent labs reviewed. I have personally reviewed and have noted: 1) the patient's medical and social history 2) The patient's current medications and supplements 3) The patient's height, weight, and BMI have been recorded in the chart

## 2023-11-26 NOTE — Assessment & Plan Note (Signed)
BP Readings from Last 3 Encounters:  11/26/23 118/76  11/23/23 (!) 156/84  11/20/22 138/84   Stable, pt to continue medical treatment norvasc 5 every day,

## 2023-11-26 NOTE — Assessment & Plan Note (Signed)
Now 127, has been slightly low in past, no recent GI distress, reduced po intake, or diuretic use.  ? SIADH vs recent prednisone vs other - for f/u lab with SIADH labs

## 2023-11-26 NOTE — Assessment & Plan Note (Signed)
Last vitamin D Lab Results  Component Value Date   VD25OH 57.43 11/24/2023   Stable, cont oral replacement

## 2023-11-26 NOTE — Assessment & Plan Note (Addendum)
Lab Results  Component Value Date   LDLCALC 90 11/24/2023   uncontrolled, pt to continue current statin crestor 40 every day, and add zetia 10 every day, with repeat lab testing in 1 mo, also fro Card Ct score

## 2023-12-31 ENCOUNTER — Other Ambulatory Visit: Payer: Self-pay | Admitting: Internal Medicine

## 2023-12-31 ENCOUNTER — Ambulatory Visit (HOSPITAL_COMMUNITY)
Admission: RE | Admit: 2023-12-31 | Discharge: 2023-12-31 | Disposition: A | Discharge status: Home / Self Care | Payer: Self-pay | Source: Ambulatory Visit | Attending: Internal Medicine | Admitting: Internal Medicine

## 2023-12-31 DIAGNOSIS — R9431 Abnormal electrocardiogram [ECG] [EKG]: Secondary | ICD-10-CM | POA: Insufficient documentation

## 2023-12-31 DIAGNOSIS — E7800 Pure hypercholesterolemia, unspecified: Secondary | ICD-10-CM | POA: Insufficient documentation

## 2023-12-31 DIAGNOSIS — R931 Abnormal findings on diagnostic imaging of heart and coronary circulation: Secondary | ICD-10-CM | POA: Insufficient documentation

## 2023-12-31 DIAGNOSIS — R739 Hyperglycemia, unspecified: Secondary | ICD-10-CM | POA: Insufficient documentation

## 2023-12-31 DIAGNOSIS — I739 Peripheral vascular disease, unspecified: Secondary | ICD-10-CM | POA: Insufficient documentation

## 2024-02-09 ENCOUNTER — Encounter (HOSPITAL_BASED_OUTPATIENT_CLINIC_OR_DEPARTMENT_OTHER): Payer: Self-pay

## 2024-03-03 ENCOUNTER — Institutional Professional Consult (permissible substitution) (HOSPITAL_BASED_OUTPATIENT_CLINIC_OR_DEPARTMENT_OTHER): Payer: BC Managed Care – PPO | Admitting: Nurse Practitioner

## 2024-04-26 ENCOUNTER — Other Ambulatory Visit: Payer: Self-pay | Admitting: Internal Medicine

## 2024-04-26 DIAGNOSIS — N320 Bladder-neck obstruction: Secondary | ICD-10-CM

## 2024-04-26 DIAGNOSIS — E538 Deficiency of other specified B group vitamins: Secondary | ICD-10-CM

## 2024-04-26 DIAGNOSIS — E559 Vitamin D deficiency, unspecified: Secondary | ICD-10-CM

## 2024-04-26 DIAGNOSIS — E7800 Pure hypercholesterolemia, unspecified: Secondary | ICD-10-CM

## 2024-04-26 DIAGNOSIS — R739 Hyperglycemia, unspecified: Secondary | ICD-10-CM

## 2024-04-26 NOTE — Progress Notes (Unsigned)
 Cardiology Office Note:  .   Date:  04/28/2024 ID:  Gerald Jenkins, DOB 08-18-1951, MRN [id number] PCP: Gerald Coombe, MD [facility] Providers Cardiologist:  None   Patient Profile: .      PMH Coronary artery disease CT Calcium  score 12/31/2023 CAC score 616 (76th percentile) LM 0, LAD 445, LCx 0, RCA 171 Hyperlipidemia Hypertension Former tobacco abuse Hyponatremia Stroke in 2021       History of Present Illness: .    History of Present Illness Gerald Jenkins "Gerald Jenkins" is a very pleasant 73 year old male who is here today for new cardiology consultation following an elevated calcium  score. 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 He had a physical examination in December where concerns were raised about his cholesterol levels, despite being on rosuvastatin  for ten years. His dosage was increased, and he was started on Zetia  for six months to see if his cholesterol levels would improve. Was told that he could possible discontinue ezetimibe  after 6 months of therapy. He experienced a stroke approximately four years ago and was hospitalized for one night. His cholesterol was already being monitored before the stroke. He reports he is active but does not exercise on a consistent basis. He plans to retire next year and hopes to begin a regular exercise routine. He denies chest pain, shortness of breath, palpitations, orthopnea, PND, edema, presyncope or syncope. Family history of heart disease; his father had a defibrillator but no known heart attacks. Diet includes chicken, salads, and he avoids red meat, but he is not particularly strict about his diet. He drinks two to three beers most nights.   Family history: His family history includes Cancer in an other family member; Diabetes in an other family member; Lumbar disc disease in his father; Multiple sclerosis in an other family member.  Father had ICD, lived to age  90   Diet: Limits red meat Likes salads Dinners typically Hello [employer] type meals 2-3 beers each night   Activity: [employer] [place] No regular exercise   No results found for: "LIPOA"   ROS: See HPI    Studies Reviewed: Gerald Jenkins   EKG Interpretation Date/Time:  Wednesday Apr 28 2024 11:05:33 EDT Ventricular Rate:  69 PR Interval:  192 QRS Duration:  74 QT Interval:  390 QTC Calculation: 417 R Axis:   36  Text Interpretation: Normal sinus rhythm Normal ECG When compared with ECG of 09-Nov-2020 13:04, PREVIOUS ECG IS PRESENT Confirmed by Gerald Jenkins [phone]) on 04/28/2024 11:14:07 AM      Risk Assessment/Calculations:             Physical Exam:   VS: BP 132/86   Pulse 72   Ht 6\' 1"  (1.854 m)   Wt 178 lb 1.6 oz (80.8 kg)   SpO2 97%   BMI 23.50 kg/m   Wt Readings from Last 3 Encounters:  04/28/24 178 lb 1.6 oz (80.8 kg)  11/26/23 180 lb (81.6 kg)  11/20/22 178 lb (80.7 kg)     GEN: Well nourished, well developed in no acute distress NECK: No JVD; No carotid bruits CARDIAC: RRR, no murmurs, rubs, gallops RESPIRATORY:  Clear to auscultation without rales, wheezing or rhonchi  ABDOMEN: Soft, non-tender, non-distended EXTREMITIES:  No edema; No deformity     ASSESSMENT AND PLAN: .    Assessment & Plan Coronary artery disease CT calcium  score 12/31/23 revealed CAC score of  616 (76th percentile) with bulk of calcification in LAD and RCA. He continues to work and is generally active but does not exercise on a consistent basis. EKG today with no ST abnormality. He denies chest pain, dyspnea, or other symptoms concerning for angina. Due to high calcium  score and additional risk factors of hypertension and hyperlipidemia along with prior embolic stroke in 2021, further ischemia evaluation is warranted. We will get cardiac PET/CT for ischemia evaluation. Advised goal LDL < 55 mg/dL. Ezetimibe  recently added to max dose rosuvastatin  by PCP. He is on low dose aspirin   with no bleeding concerns. ER precautions advised.  Continue aspirin , amlodipine , rosuvastatin , and ezetimibe .  Hyperlipidemia LDL goal < 55 He has long-standing hyperlipidemia managed with rosuvastatin . Ezetimibe  recently added by PCP due to LDL above target. He is due for follow-up labs with PCP in June. Advised against discontinuing ezetimibe  even if the LDL goal is achieved. Recommend a heart-healthy diet focusing on lean proteins, vegetables, fruits, and whole grains. Encourage regular exercise, aiming for 30 minutes of walking or similar activity at least five days a week for goal of 150 minutes of moderate intensity exercise.   Stroke   He experienced a mild stroke approximately four years ago, likely thromboembolic. Echocardiogram 10/2020 revealed normal LVEF, no significant valve disease, no ASD. Carotid ultrasound 2019 showed 1-39% stenosis bilaterally.   Carotid artery disease 1 to 39% bilateral stenosis on carotid duplex 12/2017.  He is asymptomatic.  Consider repeat carotid duplex for surveillance.  Hypertension   BP has been well controlled. No change in anti-hypertensive therapy today. Management per PCP.     Plan/Goals: 1: Aim to get 150 minutes of moderate intensity exercise each week 2: Eat a heart heatlhy diet mostly consisting of lean protein (fish, chicken, [place]), whole grains, vegetables, and fruits and avoiding saturated fat, processed food, sugar, and simple carbohydrates      Informed Consent   Shared Decision Making/Informed Consent The risks [chest pain, shortness of breath, cardiac arrhythmias, dizziness, blood pressure fluctuations, myocardial infarction, stroke/transient ischemic attack, nausea, vomiting, allergic reaction, radiation exposure, metallic taste sensation and life-threatening complications (estimated to be 1 in 10,000)], benefits (risk stratification, diagnosing coronary artery disease, treatment guidance) and alternatives of a nuclear stress test  were discussed in detail with Mr. Lene and he agrees to proceed.     Disposition: 6 months with me  Signed, Gerald Duncan, NP-C

## 2024-04-28 ENCOUNTER — Encounter (HOSPITAL_BASED_OUTPATIENT_CLINIC_OR_DEPARTMENT_OTHER): Payer: Self-pay | Admitting: Nurse Practitioner

## 2024-04-28 ENCOUNTER — Ambulatory Visit (HOSPITAL_BASED_OUTPATIENT_CLINIC_OR_DEPARTMENT_OTHER): Payer: BC Managed Care – PPO | Admitting: Nurse Practitioner

## 2024-04-28 VITALS — BP 132/86 | HR 72 | Ht 73.0 in | Wt 178.1 lb

## 2024-04-28 DIAGNOSIS — I2510 Atherosclerotic heart disease of native coronary artery without angina pectoris: Secondary | ICD-10-CM

## 2024-04-28 DIAGNOSIS — E785 Hyperlipidemia, unspecified: Secondary | ICD-10-CM

## 2024-04-28 DIAGNOSIS — I6523 Occlusion and stenosis of bilateral carotid arteries: Secondary | ICD-10-CM

## 2024-04-28 DIAGNOSIS — R931 Abnormal findings on diagnostic imaging of heart and coronary circulation: Secondary | ICD-10-CM | POA: Diagnosis not present

## 2024-04-28 DIAGNOSIS — I10 Essential (primary) hypertension: Secondary | ICD-10-CM

## 2024-04-28 DIAGNOSIS — I6330 Cerebral infarction due to thrombosis of unspecified cerebral artery: Secondary | ICD-10-CM

## 2024-04-28 NOTE — Patient Instructions (Signed)
 Medication Instructions:   Your physician recommends that you continue on your current medications as directed. Please refer to the Current Medication list given to you today.   *If you need a refill on your cardiac medications before your next appointment, please call your pharmacy*  Lab Work:  None ordered.  If you have labs (blood work) drawn today and your tests are completely normal, you will receive your results only by: MyChart Message (if you have MyChart) OR A paper copy in the mail If you have any lab test that is abnormal or we need to change your treatment, we will call you to review the results.  Testing/Procedures:  You are scheduled for a Myocardial Perfusion Imaging Study on Wednesday, June 11 at 8:15 am.   Please arrive 15 minutes prior to your appointment time for registration and insurance purposes.   The test will take approximately 3 to 4 hours to complete; you may bring reading material. If someone comes with you to your appointment, they will need to remain in the main lobby due to limited space in the testing area.    How to prepare for your Myocardial Perfusion test:   Do not eat or drink 3 hours prior to your test, except you may have water.    Do not consume products containing caffeine (regular or decaffeinated) 12 hours prior to your test (ex: coffee, chocolate, soda, tea)   Do bring a list of your current medications with you. If not listed below, you may take your medications as normal.   Bring any held medication to your appointment, as you may be required to take it once the test is complete.   Do wear comfortable clothes (no overalls) and walking shoes. Tennis shoes are preferred. No  open toed shoes.  Do not wear cologne, aftershave or lotions (deodorant is allowed).   If these instructions are not followed, you test will have to be rescheduled.   Please report to [address] for your test. If you have questions or  concerns about your appointment, please call the Nuclear Lab at #[phone].  If you cannot keep your appointment, please provide 24 hour notification to the Nuclear lab to avoid a possible $50 charge to your account.      Follow-Up: At [facility], you and your health needs are our priority.  As part of our continuing mission to provide you with exceptional heart care, our providers are all part of one team.  This team includes your primary Cardiologist (physician) and Advanced Practice Providers or APPs (Physician Assistants and Nurse Practitioners) who all work together to provide you with the care you need, when you need it.  Your next appointment:   6 month(s)  Provider:   Slater Duncan, NP    We recommend signing up for the patient portal called "MyChart".  Sign up information is provided on this After Visit Summary.  MyChart is used to connect with patients for Virtual Visits (Telemedicine).  Patients are able to view lab/test results, encounter notes, upcoming appointments, etc.  Non-urgent messages can be sent to your provider as well.   To learn more about what you can do with MyChart, go to [email or web address].   Other Instructions     Mediterranean Diet  Why follow it? Research shows. Those who follow the Mediterranean diet have a reduced risk of heart disease  The diet is associated with a reduced incidence of Parkinson's and Alzheimer's diseases People following the diet  may have longer life expectancies and lower rates of chronic diseases  The Dietary Guidelines for Americans recommends the Mediterranean diet as an eating plan to promote health and prevent disease  What Is the Mediterranean Diet?  Healthy eating plan based on typical foods and recipes of Mediterranean-style cooking The diet is primarily a plant based diet; these foods should make up a majority of meals   Starches - Plant based foods should make up a majority of meals - They are an  important sources of vitamins, minerals, energy, antioxidants, and fiber - Choose whole grains, foods high in fiber and minimally processed items  - Typical grain sources include wheat, oats, barley, corn, brown rice, bulgar, farro, millet, polenta, couscous  - Various types of beans include chickpeas, lentils, fava beans, black beans, white beans   Fruits  Veggies - Large quantities of antioxidant rich fruits & veggies; 6 or more servings  - Vegetables can be eaten raw or lightly drizzled with oil and cooked  - Vegetables common to the traditional Mediterranean Diet include: artichokes, arugula, beets, broccoli, brussel sprouts, cabbage, carrots, celery, collard greens, cucumbers, eggplant, kale, leeks, lemons, lettuce, mushrooms, okra, onions, peas, peppers, potatoes, pumpkin, radishes, rutabaga, shallots, spinach, sweet potatoes, turnips, zucchini - Fruits common to the Mediterranean Diet include: apples, apricots, avocados, cherries, clementines, dates, figs, grapefruits, grapes, melons, nectarines, oranges, peaches, pears, pomegranates, strawberries, tangerines  Fats - Replace butter and margarine with healthy oils, such as olive oil, canola oil, and tahini  - Limit nuts to no more than a handful a day  - Nuts include walnuts, almonds, pecans, pistachios, pine nuts  - Limit or avoid candied, honey roasted or heavily salted nuts - Olives are central to the [employer] - can be eaten whole or used in a variety of dishes   Meats Protein - Limiting red meat: no more than a few times a month - When eating red meat: choose lean cuts and keep the portion to the size of deck of cards - Eggs: approx. 0 to 4 times a week  - Fish and lean poultry: at least 2 a week  - Healthy protein sources include, chicken, [place], lean beef, lamb - Increase intake of seafood such as tuna, salmon, trout, mackerel, shrimp, scallops - Avoid or limit high fat processed meats such as sausage and bacon  Dairy -  Include moderate amounts of low fat dairy products  - Focus on healthy dairy such as fat free yogurt, skim milk, low or reduced fat cheese - Limit dairy products higher in fat such as whole or 2% milk, cheese, ice cream  Alcohol - Moderate amounts of red wine is ok  - No more than 5 oz daily for women (all ages) and men older than age 55  - No more than 10 oz of wine daily for men younger than 50  Other - Limit sweets and other desserts  - Use herbs and spices instead of salt to flavor foods  - Herbs and spices common to the traditional Mediterranean Diet include: basil, bay leaves, chives, cloves, cumin, fennel, garlic, lavender, marjoram, mint, oregano, parsley, pepper, rosemary, sage, savory, sumac, tarragon, thyme   It's not just a diet, it's a lifestyle:  The Mediterranean diet includes lifestyle factors typical of those in the region  Foods, drinks and meals are best eaten with others and savored Daily physical activity is important for overall good health This could be strenuous exercise like running and aerobics This could also be  more leisurely activities such as walking, housework, yard-work, or taking the stairs Moderation is the key; a balanced and healthy diet accommodates most foods and drinks Consider portion sizes and frequency of consumption of certain foods   Meal Ideas & Options:  Breakfast:  Whole wheat toast or whole wheat English muffins with peanut butter & hard boiled egg Steel cut oats topped with apples & cinnamon and skim milk  Fresh fruit: banana, strawberries, melon, berries, peaches  Smoothies: strawberries, bananas, greek yogurt, peanut butter Low fat greek yogurt with blueberries and granola  Egg white omelet with spinach and mushrooms Breakfast couscous: whole wheat couscous, apricots, skim milk, cranberries  Sandwiches:  Hummus and grilled vegetables (peppers, zucchini, squash) on whole wheat bread   Grilled chicken on whole wheat pita with lettuce,  tomatoes, cucumbers or tzatziki  [place] salad on whole wheat bread: tuna salad made with greek yogurt, olives, red peppers, capers, green onions Garlic rosemary lamb pita: lamb sauted with garlic, rosemary, salt & pepper; add lettuce, cucumber, greek yogurt to pita - flavor with lemon juice and black pepper  Seafood:  Mediterranean grilled salmon, seasoned with garlic, basil, parsley, lemon juice and black pepper Shrimp, lemon, and spinach whole-grain pasta salad made with low fat greek yogurt  Seared scallops with lemon orzo  Seared tuna steaks seasoned salt, pepper, coriander topped with tomato mixture of olives, tomatoes, olive oil, minced garlic, parsley, green onions and cappers  Meats:  Herbed greek chicken salad with kalamata olives, cucumber, feta  Red bell peppers stuffed with spinach, bulgur, lean ground beef (or lentils) & topped with feta   Kebabs: skewers of chicken, tomatoes, onions, zucchini, squash  [place] burgers: made with red onions, mint, dill, lemon juice, feta cheese topped with roasted red peppers Vegetarian Cucumber salad: cucumbers, artichoke hearts, celery, red onion, feta cheese, tossed in olive oil & lemon juice  Hummus and whole grain pita points with a greek salad (lettuce, tomato, feta, olives, cucumbers, red onion) Lentil soup with celery, carrots made with vegetable broth, garlic, salt and pepper  Tabouli salad: parsley, bulgur, mint, scallions, cucumbers, tomato, radishes, lemon juice, olive oil, salt and pepper.  Adopting a Healthy Lifestyle.   Weight: Know what a healthy weight is for you (roughly BMI <25) and aim to maintain this. You can calculate your body mass index on your smart phone. Unfortunately, this is not the most accurate measure of healthy weight, but it is the simplest measurement to use. A more accurate measurement involves body scanning which measures lean muscle, fat tissue and bony density. We do not have this equipment at [facility].     Diet: Aim for 7+ servings of fruits and vegetables daily Limit animal fats in diet for cholesterol and heart health - choose grass fed whenever available Avoid highly processed foods (fast food burgers, tacos, fried chicken, pizza, hot dogs, french fries)  Saturated fat comes in the form of butter, lard, coconut oil, margarine, partially hydrogenated oils, and fat in meat. These increase your risk of cardiovascular disease.  Use healthy plant oils, such as olive, canola, soy, corn, sunflower and peanut.  Whole foods such as fruits, vegetables and whole grains have fiber  Men need > 38 grams of fiber per day Women need > 25 grams of fiber per day  Load up on vegetables and fruits - one-half of your plate: Aim for color and variety, and remember that potatoes dont count. Go for whole grains - one-quarter of your plate: Whole wheat, barley, wheat  berries, quinoa, oats, brown rice, and foods made with them. If you want pasta, go with whole wheat pasta. Protein power - one-quarter of your plate: Fish, chicken, beans, and nuts are all healthy, versatile protein sources. Limit red meat. You need carbohydrates for energy! The type of carbohydrate is more important than the amount. Choose carbohydrates such as vegetables, fruits, whole grains, beans, and nuts in the place of white rice, white pasta, potatoes (baked or fried), macaroni and cheese, cakes, cookies, and donuts.  If youre thirsty, drink water. Coffee and tea are good in moderation, but skip sugary drinks and limit milk and dairy products to one or two daily servings. Keep sugar intake at 6 teaspoons or 24 grams or LESS       Exercise: Aim for 150 min of moderate intensity exercise weekly for heart health, and weights twice weekly for bone health Stay active - any steps are better than no steps! Aim for 7-9 hours of sleep daily       Goals: 1: Aim to get 150 minutes of moderate intensity exercise each week 2: Eat a heart heatlhy  diet mostly consisting of lean protein (fish, chicken, [place]), whole grains, vegetables, and fruits and avoiding saturated fat, processed food, sugar, and simple carbohydrates

## 2024-05-18 ENCOUNTER — Telehealth (HOSPITAL_COMMUNITY): Payer: Self-pay | Admitting: *Deleted

## 2024-05-18 NOTE — Telephone Encounter (Signed)
 Left message on voicemail per DPR in reference to upcoming appointment scheduled on 05/26/24  with detailed instructions given per Myocardial Perfusion Study Information Sheet for the test. LM to arrive 15 minutes early, and that it is imperative to arrive on time for appointment to keep from having the test rescheduled. If you need to cancel or reschedule your appointment, please call the office within 24 hours of your appointment. Failure to do so may result in a cancellation of your appointment, and a $50 no show fee. Phone number given for call back for any questions. Argentina Bees, RN

## 2024-05-21 ENCOUNTER — Other Ambulatory Visit (HOSPITAL_BASED_OUTPATIENT_CLINIC_OR_DEPARTMENT_OTHER): Payer: Self-pay | Admitting: Nurse Practitioner

## 2024-05-21 DIAGNOSIS — R931 Abnormal findings on diagnostic imaging of heart and coronary circulation: Secondary | ICD-10-CM

## 2024-05-21 DIAGNOSIS — I6330 Cerebral infarction due to thrombosis of unspecified cerebral artery: Secondary | ICD-10-CM

## 2024-05-21 DIAGNOSIS — I6523 Occlusion and stenosis of bilateral carotid arteries: Secondary | ICD-10-CM

## 2024-05-26 ENCOUNTER — Ambulatory Visit (HOSPITAL_COMMUNITY)
Admission: RE | Admit: 2024-05-26 | Discharge: 2024-05-26 | Disposition: A | Discharge status: Home / Self Care | Source: Ambulatory Visit | Attending: Cardiology | Admitting: Cardiology

## 2024-05-26 DIAGNOSIS — I6330 Cerebral infarction due to thrombosis of unspecified cerebral artery: Secondary | ICD-10-CM | POA: Diagnosis present

## 2024-05-26 DIAGNOSIS — I6523 Occlusion and stenosis of bilateral carotid arteries: Secondary | ICD-10-CM

## 2024-05-26 DIAGNOSIS — R931 Abnormal findings on diagnostic imaging of heart and coronary circulation: Secondary | ICD-10-CM

## 2024-05-26 LAB — MYOCARDIAL PERFUSION IMAGING
Angina Index: 0
Duke Treadmill Score: 4
Estimated workload: 6.1
Exercise duration (min): 4 min
Exercise duration (sec): 15 s
LV dias vol: 78 mL (ref 62–150)
LV sys vol: 23 mL
MPHR: 148 {beats}/min
Nuc Stress EF: 71 %
Peak HR: 148 {beats}/min
Percent HR: 100 %
RPE: 19
Rest HR: 83 {beats}/min
Rest Nuclear Isotope Dose: 9.7 mCi
SDS: 0
SRS: 1
SSS: 0
ST Depression (mm): 0 mm

## 2024-05-26 MED ORDER — TECHNETIUM TC 99M TETROFOSMIN IV KIT
30.9000 | PACK | Freq: Once | INTRAVENOUS | Status: AC | PRN
  Administered 2024-05-26: 30.9 via INTRAVENOUS

## 2024-05-26 MED ORDER — TECHNETIUM TC 99M TETROFOSMIN IV KIT
9.7000 | PACK | Freq: Once | INTRAVENOUS | Status: AC | PRN
  Administered 2024-05-26: 9.7 via INTRAVENOUS

## 2024-05-27 ENCOUNTER — Ambulatory Visit (HOSPITAL_BASED_OUTPATIENT_CLINIC_OR_DEPARTMENT_OTHER): Payer: Self-pay | Admitting: Nurse Practitioner

## 2024-06-11 ENCOUNTER — Ambulatory Visit: Payer: Self-pay | Admitting: Nurse Practitioner

## 2024-08-19 ENCOUNTER — Other Ambulatory Visit: Payer: Self-pay | Admitting: Internal Medicine

## 2024-09-10 ENCOUNTER — Telehealth: Payer: Self-pay | Admitting: Internal Medicine

## 2024-09-10 NOTE — Telephone Encounter (Signed)
 Pt has CPE 12/17 and lab visit 12/15, current lab orders will expire in November.  Please extend lab order for December visit.

## 2024-11-26 ENCOUNTER — Other Ambulatory Visit

## 2024-11-26 DIAGNOSIS — E7800 Pure hypercholesterolemia, unspecified: Secondary | ICD-10-CM | POA: Diagnosis not present

## 2024-11-26 DIAGNOSIS — E559 Vitamin D deficiency, unspecified: Secondary | ICD-10-CM

## 2024-11-26 DIAGNOSIS — R739 Hyperglycemia, unspecified: Secondary | ICD-10-CM | POA: Diagnosis not present

## 2024-11-26 DIAGNOSIS — N320 Bladder-neck obstruction: Secondary | ICD-10-CM

## 2024-11-26 DIAGNOSIS — E538 Deficiency of other specified B group vitamins: Secondary | ICD-10-CM

## 2024-11-26 LAB — LIPID PANEL
Cholesterol: 145 mg/dL (ref 0–200)
HDL: 81.3 mg/dL (ref >39.00)
LDL Cholesterol: 48 mg/dL (ref 0–99)
NonHDL: 63.58
Total CHOL/HDL Ratio: 2
Triglycerides: 76 mg/dL (ref 0.0–149.0)
VLDL: 15.2 mg/dL (ref 0.0–40.0)

## 2024-11-26 LAB — CBC WITH DIFFERENTIAL/PLATELET
Basophils Absolute: 0.1 K/uL (ref 0.0–0.1)
Basophils Relative: 1 % (ref 0.0–3.0)
Eosinophils Absolute: 0.2 K/uL (ref 0.0–0.7)
Eosinophils Relative: 3.8 % (ref 0.0–5.0)
HCT: 40.7 % (ref 39.0–52.0)
Hemoglobin: 14.1 g/dL (ref 13.0–17.0)
Lymphocytes Relative: 34.2 % (ref 12.0–46.0)
Lymphs Abs: 1.9 K/uL (ref 0.7–4.0)
MCHC: 34.7 g/dL (ref 30.0–36.0)
MCV: 88.1 fl (ref 78.0–100.0)
Monocytes Absolute: 0.6 K/uL (ref 0.1–1.0)
Monocytes Relative: 11.7 % (ref 3.0–12.0)
Neutro Abs: 2.7 K/uL (ref 1.4–7.7)
Neutrophils Relative %: 49.3 % (ref 43.0–77.0)
Platelets: 259 K/uL (ref 150.0–400.0)
RBC: 4.62 Mil/uL (ref 4.22–5.81)
RDW: 13.5 % (ref 11.5–15.5)
WBC: 5.6 K/uL (ref 4.0–10.5)

## 2024-11-26 LAB — BASIC METABOLIC PANEL WITH GFR
BUN: 13 mg/dL (ref 6–23)
CO2: 28 meq/L (ref 19–32)
Calcium: 9.5 mg/dL (ref 8.4–10.5)
Chloride: 97 meq/L (ref 96–112)
Creatinine, Ser: 0.95 mg/dL (ref 0.40–1.50)
GFR: 79.95 mL/min (ref >60.00)
Glucose, Bld: 74 mg/dL (ref 70–99)
Potassium: 4.3 meq/L (ref 3.5–5.1)
Sodium: 134 meq/L — ABNORMAL LOW (ref 135–145)

## 2024-11-26 LAB — VITAMIN B12: Vitamin B-12: 735 pg/mL (ref 211–911)

## 2024-11-26 LAB — VITAMIN D 25 HYDROXY (VIT D DEFICIENCY, FRACTURES): VITD: 82.8 ng/mL (ref 30.00–100.00)

## 2024-11-26 LAB — HEPATIC FUNCTION PANEL
ALT: 20 U/L (ref 0–53)
AST: 20 U/L (ref 0–37)
Albumin: 4.7 g/dL (ref 3.5–5.2)
Alkaline Phosphatase: 39 U/L (ref 39–117)
Bilirubin, Direct: 0.1 mg/dL (ref 0.0–0.3)
Total Bilirubin: 0.4 mg/dL (ref 0.2–1.2)
Total Protein: 7.2 g/dL (ref 6.0–8.3)

## 2024-11-26 LAB — URINALYSIS, ROUTINE W REFLEX MICROSCOPIC
Bilirubin Urine: NEGATIVE
Hgb urine dipstick: NEGATIVE
Ketones, ur: NEGATIVE
Leukocytes,Ua: NEGATIVE
Nitrite: NEGATIVE
Specific Gravity, Urine: 1.01 (ref 1.000–1.030)
Total Protein, Urine: NEGATIVE
Urine Glucose: NEGATIVE
Urobilinogen, UA: 0.2 (ref 0.0–1.0)
pH: 6.5 (ref 5.0–8.0)

## 2024-11-26 LAB — MICROALBUMIN / CREATININE URINE RATIO
Creatinine,U: 46.3 mg/dL
Microalb Creat Ratio: 49.7 mg/g — ABNORMAL HIGH (ref 0.0–30.0)
Microalb, Ur: 2.3 mg/dL — ABNORMAL HIGH (ref 0.0–1.9)

## 2024-11-26 LAB — TSH: TSH: 4.3 u[IU]/mL (ref 0.35–5.50)

## 2024-11-26 LAB — HEMOGLOBIN A1C: Hgb A1c MFr Bld: 5.7 % (ref 4.6–6.5)

## 2024-11-26 LAB — PSA: PSA: 1.43 ng/mL (ref 0.10–4.00)

## 2024-11-29 ENCOUNTER — Other Ambulatory Visit: Payer: BC Managed Care – PPO

## 2024-12-01 ENCOUNTER — Ambulatory Visit: Payer: BC Managed Care – PPO | Admitting: Internal Medicine

## 2024-12-01 ENCOUNTER — Encounter: Payer: Self-pay | Admitting: Internal Medicine

## 2024-12-01 VITALS — BP 132/86 | HR 73 | Temp 98.0°F | Ht 73.0 in | Wt 179.0 lb

## 2024-12-01 DIAGNOSIS — I10 Essential (primary) hypertension: Secondary | ICD-10-CM

## 2024-12-01 DIAGNOSIS — Z23 Encounter for immunization: Secondary | ICD-10-CM

## 2024-12-01 DIAGNOSIS — R739 Hyperglycemia, unspecified: Secondary | ICD-10-CM | POA: Diagnosis not present

## 2024-12-01 DIAGNOSIS — E559 Vitamin D deficiency, unspecified: Secondary | ICD-10-CM

## 2024-12-01 DIAGNOSIS — Z125 Encounter for screening for malignant neoplasm of prostate: Secondary | ICD-10-CM | POA: Diagnosis not present

## 2024-12-01 DIAGNOSIS — E538 Deficiency of other specified B group vitamins: Secondary | ICD-10-CM | POA: Diagnosis not present

## 2024-12-01 DIAGNOSIS — E785 Hyperlipidemia, unspecified: Secondary | ICD-10-CM | POA: Diagnosis not present

## 2024-12-01 DIAGNOSIS — Z0000 Encounter for general adult medical examination without abnormal findings: Secondary | ICD-10-CM

## 2024-12-01 DIAGNOSIS — Z1211 Encounter for screening for malignant neoplasm of colon: Secondary | ICD-10-CM

## 2024-12-01 NOTE — Assessment & Plan Note (Signed)
 Lab Results  Component Value Date   VITAMINB12 735 11/26/2024   Stable, cont oral replacement - b12 1000 mcg qd

## 2024-12-01 NOTE — Assessment & Plan Note (Signed)
 BP Readings from Last 3 Encounters:  12/01/24 132/86  04/28/24 132/86  11/26/23 118/76   Stable, pt to continue medical treatment norvasc  5 every day,

## 2024-12-01 NOTE — Assessment & Plan Note (Signed)
 Lab Results  Component Value Date   HGBA1C 5.7 11/26/2024   Stable, pt to continue current medical treatment  - diet, wt control

## 2024-12-01 NOTE — Addendum Note (Signed)
 Addended by: LEAR, Afra Tricarico P on: 12/01/2024 02:39 PM   Modules accepted: Orders

## 2024-12-01 NOTE — Assessment & Plan Note (Signed)
 Last vitamin D  Lab Results  Component Value Date   VD25OH 82.80 11/26/2024   Stable, cont oral replacement

## 2024-12-01 NOTE — Patient Instructions (Signed)
 You had the flu shot today  Please have your Shingrix (shingles) shots done at your local pharmacy.  Ok to continue the Vitamin D  and B12 as you are doing  Please continue all other medications as before, and refills have been done if requested.  Please have the pharmacy call with any other refills you may need.  Please continue your efforts at being more active, low cholesterol diet, and weight control.  You are otherwise up to date with prevention measures today.  Please keep your appointments with your specialists as you may have planned  You will be contacted regarding the referral for: Cologuard  Your lab testing was good !  Please make an Appointment to return for your 1 year visit, or sooner if needed, with Lab testing by Appointment as well, to be done about 3-5 days before at the FIRST FLOOR Lab (so this is for TWO appointments - please see the scheduling desk as you leave)

## 2024-12-01 NOTE — Assessment & Plan Note (Signed)

## 2024-12-01 NOTE — Progress Notes (Signed)
 Patient ID: Gerald Jenkins, male   DOB: Dec 16, 1951, 73 y.o.   MRN: [id number]         Chief Complaint:: wellness exam and low vit d, htn, hld, low b12       HPI:  Gerald Jenkins is a 73 y.o. male here for wellness exam; for shingrix at pharmacy, for flu shot today, due for cologaurd next month.  O/w up to date                        Also Pt denies chest pain, increased sob or doe, wheezing, orthopnea, PND, increased LE swelling, palpitations, dizziness or syncope.   Pt denies polydipsia, polyuria, or new focal neuro s/s.    Pt denies fever, wt loss, night sweats, loss of appetite, or other constitutional symptoms     Wt Readings from Last 3 Encounters:  12/01/24 179 lb (81.2 kg)  04/28/24 178 lb 1.6 oz (80.8 kg)  11/26/23 180 lb (81.6 kg)   BP Readings from Last 3 Encounters:  12/01/24 132/86  04/28/24 132/86  11/26/23 118/76   Immunization History  Administered Date(s) Administered   Fluad Quad(high Dose 65+) 11/08/2019, 11/08/2020, 11/14/2021, 11/20/2022   Fluad Trivalent(High Dose 65+) 11/26/2023   INFLUENZA, HIGH DOSE SEASONAL PF 11/06/2018   Influenza,inj,Quad PF,6+ Mos 09/07/2015, 10/02/2016   PFIZER(Purple Top)SARS-COV-2 Vaccination 01/06/2020, 01/27/2020, 03/20/2021, 08/02/2021   Pneumococcal Conjugate-13 11/08/2020   Pneumococcal Polysaccharide-23 11/08/2019   Td 07/10/2009   Tdap 11/08/2019   Zoster, Live 08/19/2013   Health Maintenance Due  Topic Date Due   Zoster Vaccines- Shingrix (1 of 2) 05/18/1971   Colonoscopy  Never done   Influenza Vaccine  07/16/2024   COVID-19 Vaccine (5 - 2025-26 season) 08/16/2024      Past Medical History:  Diagnosis Date   ANXIETY 01/19/2008   Cervicalgia 01/19/2008   DISORDER OF BONE AND CARTILAGE UNSPECIFIED 01/11/2008   Heart murmur    when you were born, states he outgrew   HYPERLIPIDEMIA 01/19/2008   Hypertension    Lumbar spinal stenosis 11/08/2020   Melanoma (HCC) 09/09/2015   Optic neuropathy    Osteoarthritis, 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 His smoking use included cigarettes. He has never used smokeless tobacco. He reports current alcohol use of about 10.0 standard drinks of alcohol per week. No history on file for drug use. family history includes Cancer in an other family member; Diabetes in an other family member; Lumbar disc disease in his father; Multiple sclerosis in an other family member. Allergies[1] Medications Ordered Prior to Encounter[2]      ROS:  All others reviewed and negative.  Objective        PE:  BP 132/86   Pulse 73   Temp 98 F (36.7 C) (Temporal)   Ht 6' 1 (1.854 m)   Wt 179 lb (81.2 kg)   SpO2 95%   BMI 23.62 kg/m                 Constitutional: Pt appears in NAD  HENT: Head: NCAT.                Right Ear: External ear normal.                 Left Ear: External ear normal.                Eyes: . Pupils are equal, round, and reactive to light. Conjunctivae and EOM are normal               Nose: without d/c or deformity               Neck: Neck supple. Gross normal ROM               Cardiovascular: Normal rate and regular rhythm.                 Pulmonary/Chest: Effort normal and breath sounds without rales or wheezing.                Abd:  Soft, NT, ND, + BS, no organomegaly               Neurological: Pt is alert. At baseline orientation, motor grossly intact               Skin: Skin is warm. No rashes, no other new lesions, LE edema - none               Psychiatric: Pt behavior is normal without agitation   Micro: none  Cardiac tracings I have personally interpreted today:  none  Pertinent Radiological findings (summarize): none   Lab Results  Component Value Date    WBC 5.6 11/26/2024   HGB 14.1 11/26/2024   HCT 40.7 11/26/2024   PLT 259.0 11/26/2024   GLUCOSE 74 11/26/2024   CHOL 145 11/26/2024   TRIG 76.0 11/26/2024   HDL 81.30 11/26/2024   LDLDIRECT 66.4 07/09/2010   LDLCALC 48 11/26/2024   ALT 20 11/26/2024   AST 20 11/26/2024   NA 134 (L) 11/26/2024   K 4.3 11/26/2024   CL 97 11/26/2024   CREATININE 0.95 11/26/2024   BUN 13 11/26/2024   CO2 28 11/26/2024   TSH 4.30 11/26/2024   PSA 1.43 11/26/2024   INR 0.9 11/09/2020   HGBA1C 5.7 11/26/2024   MICROALBUR 2.3 (H) 11/26/2024   Assessment/Plan:  Gerald Jenkins is a 73 y.o. White or Caucasian [1] male with  has a past medical history of ANXIETY (01/19/2008), Cervicalgia (01/19/2008), DISORDER OF BONE AND CARTILAGE UNSPECIFIED (01/11/2008), Heart murmur, HYPERLIPIDEMIA (01/19/2008), Hypertension, Lumbar spinal stenosis (11/08/2020), Melanoma (HCC) (09/09/2015), Optic neuropathy, Osteoarthritis, hand (09/09/2015), PSA, INCREASED (07/12/2010), RASH-NONVESICULAR (01/19/2008), and Stroke (HCC) (10/2020).  Preventative health care Age and sex appropriate education and counseling updated with regular exercise and diet Referrals for preventative services - none needed Immunizations addressed - for shingrix at pharmacy Smoking counseling  - none needed Evidence for depression or other mood disorder - none significant Most recent labs reviewed. I have personally reviewed and have noted: 1) the patient's medical and social history 2) The patient's current medications and supplements 3) The patient's height, weight, and BMI have been recorded in the chart   Vitamin D  deficiency Last vitamin D  Lab Results  Component Value Date   VD25OH 82.80 11/26/2024   Stable, cont oral replacement   Hyperglycemia Lab Results  Component Value Date   HGBA1C 5.7 11/26/2024   Stable, pt to continue current medical treatment  - diet, wt  control   HTN (hypertension) BP Readings from Last 3 Encounters:  12/01/24  132/86  04/28/24 132/86  11/26/23 118/76   Stable, pt to continue medical treatment norvasc  5 every day,    HLD (hyperlipidemia) Lab Results  Component Value Date   LDLCALC 48 11/26/2024   Stable, pt to continue current statin crestor  40 every day, zetia  10 qd   B12 deficiency Lab Results  Component Value Date   VITAMINB12 735 11/26/2024   Stable, cont oral replacement - b12 1000 mcg qd  Followup: Return in about 1 year (around 12/01/2025).  Lynwood Rush, MD 12/01/2024 1:15 PM [facility]  Primary Care - [facility]     [1]  Allergies Allergen Reactions   Penicillins Other (See Comments)    Childhood allergy  [2]  Current Outpatient Medications on File Prior to Visit  Medication Sig Dispense Refill   acetaminophen  (TYLENOL ) 500 MG tablet Take 1,000 mg by mouth every 8 (eight) hours as needed for mild pain.     amLODipine  (NORVASC ) 5 MG tablet TAKE 1 TABLET BY MOUTH DAILY 90 tablet 3   aspirin  EC 81 MG EC tablet Take 1 tablet (81 mg total) by mouth daily. Swallow whole. 30 tablet 11   Cholecalciferol (VITAMIN D3) 50 MCG (2000 UT) TABS Take 50 mcg by mouth daily.     Cyanocobalamin  (VITAMIN B12 PO) Take by mouth.     ezetimibe  (ZETIA ) 10 MG tablet TAKE 1 TABLET BY MOUTH ONCE DAILY 90 tablet 3   rosuvastatin  (CRESTOR ) 40 MG tablet TAKE 1 TABLET BY MOUTH DAILY 90 tablet 3   sildenafil  (VIAGRA ) 100 MG tablet TAKE 1 TABLET BY MOUTH EVERY OTHER DAY AS NEEDED 10 tablet 11   No current facility-administered medications on file prior to visit.

## 2024-12-01 NOTE — Assessment & Plan Note (Signed)
 Lab Results  Component Value Date   LDLCALC 48 11/26/2024   Stable, pt to continue current statin crestor  40 every day, zetia  10 qd

## 2024-12-30 ENCOUNTER — Telehealth: Payer: Self-pay

## 2024-12-30 DIAGNOSIS — Z1211 Encounter for screening for malignant neoplasm of colon: Secondary | ICD-10-CM

## 2024-12-30 NOTE — Telephone Encounter (Signed)
 So I am not sure what happened with the original order, but I have re sent the order today,  Hopefully he should get the kit at home soon.   Thanks!

## 2024-12-30 NOTE — Telephone Encounter (Signed)
 Copied from CRM #[id number]. Topic: Clinical - Medication Question >> Dec 30, 2024  9:09 AM Zy'onna H wrote: Reason for CRM: Patient called in stating during his physical on 12/01/2025 - PCP instructed he would be placing the patient on ColoGuard. After waiting some time the patient hasn't received any update and ws inquiring when he can expect to receive the prescription and test.  Please Advise

## 2024-12-31 NOTE — Telephone Encounter (Signed)
 Patient returned call to check on the status of previous request and has been made aware of provider notations.
